# Patient Record
Sex: Female | Born: 1975 | Race: Black or African American | Hispanic: No | Marital: Married | Smoking: Never smoker
Health system: Southern US, Community
[De-identification: ages and names within clinical notes are randomized; demographics above are authoritative.]

## PROBLEM LIST (undated history)

## (undated) ENCOUNTER — Emergency Department (HOSPITAL_BASED_OUTPATIENT_CLINIC_OR_DEPARTMENT_OTHER): Admission: EM | Payer: Medicaid Other | Source: Home / Self Care

## (undated) DIAGNOSIS — E0590 Thyrotoxicosis, unspecified without thyrotoxic crisis or storm: Secondary | ICD-10-CM

## (undated) DIAGNOSIS — K219 Gastro-esophageal reflux disease without esophagitis: Secondary | ICD-10-CM

---

## 2012-01-12 ENCOUNTER — Other Ambulatory Visit (HOSPITAL_COMMUNITY): Payer: Self-pay | Admitting: Obstetrics and Gynecology

## 2012-01-12 DIAGNOSIS — O09529 Supervision of elderly multigravida, unspecified trimester: Secondary | ICD-10-CM

## 2012-01-12 DIAGNOSIS — O26849 Uterine size-date discrepancy, unspecified trimester: Secondary | ICD-10-CM

## 2012-01-18 ENCOUNTER — Ambulatory Visit (HOSPITAL_COMMUNITY): Payer: Self-pay | Attending: Obstetrics and Gynecology

## 2012-01-18 ENCOUNTER — Ambulatory Visit (HOSPITAL_COMMUNITY): Payer: Self-pay

## 2015-11-04 ENCOUNTER — Encounter (HOSPITAL_BASED_OUTPATIENT_CLINIC_OR_DEPARTMENT_OTHER): Payer: Self-pay | Admitting: *Deleted

## 2015-11-04 ENCOUNTER — Emergency Department (HOSPITAL_BASED_OUTPATIENT_CLINIC_OR_DEPARTMENT_OTHER)
Admission: EM | Admit: 2015-11-04 | Discharge: 2015-11-05 | Disposition: A | Discharge status: Home / Self Care | Payer: Medicaid Other | Attending: Emergency Medicine | Admitting: Emergency Medicine

## 2015-11-04 ENCOUNTER — Emergency Department (HOSPITAL_BASED_OUTPATIENT_CLINIC_OR_DEPARTMENT_OTHER): Payer: Medicaid Other

## 2015-11-04 DIAGNOSIS — K219 Gastro-esophageal reflux disease without esophagitis: Secondary | ICD-10-CM

## 2015-11-04 DIAGNOSIS — R1013 Epigastric pain: Secondary | ICD-10-CM

## 2015-11-04 DIAGNOSIS — H5711 Ocular pain, right eye: Secondary | ICD-10-CM | POA: Insufficient documentation

## 2015-11-04 LAB — CBC WITH DIFFERENTIAL/PLATELET
Basophils Absolute: 0 10*3/uL (ref 0.0–0.1)
Basophils Relative: 0 %
EOS PCT: 6 %
Eosinophils Absolute: 0.4 10*3/uL (ref 0.0–0.7)
HCT: 36.8 % (ref 36.0–46.0)
HEMOGLOBIN: 12 g/dL (ref 12.0–15.0)
LYMPHS ABS: 3.3 10*3/uL (ref 0.7–4.0)
Lymphocytes Relative: 55 %
MCH: 27.6 pg (ref 26.0–34.0)
MCHC: 32.6 g/dL (ref 30.0–36.0)
MCV: 84.8 fL (ref 78.0–100.0)
MONOS PCT: 6 %
Monocytes Absolute: 0.3 10*3/uL (ref 0.1–1.0)
NEUTROS PCT: 33 %
Neutro Abs: 1.9 10*3/uL (ref 1.7–7.7)
Platelets: 249 10*3/uL (ref 150–400)
RBC: 4.34 MIL/uL (ref 3.87–5.11)
RDW: 12.7 % (ref 11.5–15.5)
WBC: 5.9 10*3/uL (ref 4.0–10.5)

## 2015-11-04 LAB — URINALYSIS, ROUTINE W REFLEX MICROSCOPIC
Bilirubin Urine: NEGATIVE
GLUCOSE, UA: NEGATIVE mg/dL
KETONES UR: NEGATIVE mg/dL
Nitrite: NEGATIVE
PROTEIN: 100 mg/dL — AB
Specific Gravity, Urine: 1.023 (ref 1.005–1.030)
pH: 6.5 (ref 5.0–8.0)

## 2015-11-04 LAB — COMPREHENSIVE METABOLIC PANEL
ALBUMIN: 4 g/dL (ref 3.5–5.0)
ALK PHOS: 44 U/L (ref 38–126)
ALT: 23 U/L (ref 14–54)
AST: 18 U/L (ref 15–41)
Anion gap: 6 (ref 5–15)
BILIRUBIN TOTAL: 0.5 mg/dL (ref 0.3–1.2)
BUN: 21 mg/dL — AB (ref 6–20)
CALCIUM: 9.3 mg/dL (ref 8.9–10.3)
CO2: 24 mmol/L (ref 22–32)
CREATININE: 0.98 mg/dL (ref 0.44–1.00)
Chloride: 107 mmol/L (ref 101–111)
GFR calc Af Amer: >60 mL/min (ref >60)
GFR calc non Af Amer: >60 mL/min (ref >60)
GLUCOSE: 98 mg/dL (ref 65–99)
Potassium: 4 mmol/L (ref 3.5–5.1)
Sodium: 137 mmol/L (ref 135–145)
TOTAL PROTEIN: 7.2 g/dL (ref 6.5–8.1)

## 2015-11-04 LAB — URINE MICROSCOPIC-ADD ON

## 2015-11-04 LAB — LIPASE, BLOOD: Lipase: 34 U/L (ref 11–51)

## 2015-11-04 LAB — TROPONIN I: Troponin I: <0.03 ng/mL (ref <0.03)

## 2015-11-04 LAB — PREGNANCY, URINE: Preg Test, Ur: NEGATIVE

## 2015-11-04 MED ORDER — GI COCKTAIL ~~LOC~~
30.0000 mL | Freq: Once | ORAL | Status: AC
  Administered 2015-11-04: 30 mL via ORAL
  Filled 2015-11-04: qty 30

## 2015-11-04 MED ORDER — FLUORESCEIN SODIUM 1 MG OP STRP
1.0000 | ORAL_STRIP | Freq: Once | OPHTHALMIC | Status: AC
  Administered 2015-11-04: 1 via OPHTHALMIC
  Filled 2015-11-04: qty 1

## 2015-11-04 MED ORDER — TETRACAINE HCL 0.5 % OP SOLN
1.0000 [drp] | Freq: Once | OPHTHALMIC | Status: AC
  Administered 2015-11-04: 1 [drp] via OPHTHALMIC
  Filled 2015-11-04: qty 4

## 2015-11-04 MED ORDER — ONDANSETRON 4 MG PO TBDP
4.0000 mg | ORAL_TABLET | Freq: Once | ORAL | Status: AC
  Administered 2015-11-04: 4 mg via ORAL
  Filled 2015-11-04: qty 1

## 2015-11-04 NOTE — ED Notes (Addendum)
ED Provider at bedside examining eye that is hurting.

## 2015-11-04 NOTE — ED Notes (Signed)
ED Provider at bedside. 

## 2015-11-04 NOTE — ED Provider Notes (Signed)
[facility] Provider Note   CSN: [id number] Arrival date & time: 11/04/15  2112  By signing my name below, I, Teofilo PodMatthew P. Jamison, attest that this documentation has been prepared under the direction and in the presence of Everlene FarrierWilliam Gaetano Romberger, PA-C. Electronically Signed: Teofilo PodMatthew P. Jamison, ED Scribe. 11/04/2015. 10:44 PM.   History   Chief Complaint Chief Complaint  Patient presents with  . Chest Pain    The history is provided by the patient and the spouse. The history is limited by a language barrier. A language interpreter was used [place]).  HPI Comments:  Ashley Owen is a 40 y.o. female who presents to the Emergency Department complaining of constant epigastric abdominal pain x 15 days. She denies nausea, vomiting or diarrhea. Pain is not worse with eating. She is unable to point to aggravating factors. Pt also complains of intermittent, gradually worsening right eye pain x several days. She reports she thinks something scratched her eye. She does not wear contacts or glasses. Pt reports associated clear eye discharge. No alleviating factors noted. Pt denies double vision, headache, fever, urinary symptoms, GERD, difficulty breathing, chest pain, vomiting, diarrhea, nausea or urinary symptoms. She is currently on her menstrual cycle.   History reviewed. No pertinent past medical history.  There are no active problems to display for this patient.   History reviewed. No pertinent surgical history.  OB History    No data available       Home Medications    Prior to Admission medications   Medication Sig Start Date End Date Taking? Authorizing Provider  erythromycin ophthalmic ointment Place 1 application into the right eye every 6 (six) hours. Place 1/2 inch ribbon of ointment in the affected eye 4 times a day 11/05/15   Everlene FarrierWilliam Kaari Zeigler, PA-C  omeprazole (PRILOSEC) 20 MG capsule Take 1 capsule (20 mg total) by mouth daily. 11/05/15   Everlene FarrierWilliam Ronin Crager, PA-C     Family History No family history on file.  Social History Social History  Substance Use Topics  . Smoking status: Never Smoker  . Smokeless tobacco: Not on file  . Alcohol use Not on file     Allergies   Review of patient's allergies indicates no known allergies.   Review of Systems Review of Systems  Constitutional: Negative for fever.  HENT: Negative for sore throat.   Eyes: Positive for pain and discharge. Negative for photophobia and visual disturbance.  Respiratory: Negative for shortness of breath and wheezing.   Cardiovascular: Negative for chest pain.  Gastrointestinal: Positive for abdominal pain. Negative for blood in stool, diarrhea, nausea and vomiting.  Genitourinary: Negative for difficulty urinating, dysuria, frequency, hematuria and urgency.  Musculoskeletal: Negative for back pain.  Skin: Negative for rash.  Neurological: Negative for headaches.     Physical Exam Updated Vital Signs BP 100/71 (BP Location: Right Arm)   Pulse 69   Temp 98.5 F (36.9 C) (Oral)   Resp 16   Wt 86.7 kg   LMP 11/04/2015   SpO2 96%   Physical Exam  Constitutional: She appears well-developed and well-nourished. No distress.  Nontoxic appearing.  HENT:  Head: Normocephalic and atraumatic.  Right Ear: External ear normal.  Left Ear: External ear normal.  Mouth/Throat: Oropharynx is clear and moist.  Bilateral tympanic membranes are pearly-gray without erythema or loss of landmarks.   Eyes: Conjunctivae and EOM are normal. Pupils are equal, round, and reactive to light. Right eye exhibits no discharge. Left eye exhibits no discharge.  No conjunctival injection or discharge from her eyes.  Bilateral eyes were anesthetized with tetracaine and right eye was stained with fluorescein. No fluorescein uptake on exam. No evidence of corneal abrasion or ulcer. No dendritic lesions. No evidence of foreign body on lid exam. Eye pressures were obtained with tono pen. These were  normal bilaterally.  Neck: Neck supple. No JVD present.  Cardiovascular: Normal rate, regular rhythm, normal heart sounds and intact distal pulses.   Pulmonary/Chest: Effort normal and breath sounds normal. No respiratory distress. She has no wheezes. She has no rales.  Abdominal: Soft. Bowel sounds are normal. She exhibits no distension and no mass. There is tenderness. There is no rebound and no guarding.  Abdomen is soft. Bowel sounds are present. Patient has mild epigastric abdominal tenderness to palpation. No lower abdominal tenderness. No CVA or flank tenderness. No right upper quadrant tenderness. No Murphy sign.  Musculoskeletal: She exhibits no edema or tenderness.  Lymphadenopathy:    She has no cervical adenopathy.  Neurological: She is alert. Coordination normal.  Skin: Skin is warm and dry. Capillary refill takes less than 2 seconds. No rash noted. She is not diaphoretic. No erythema. No pallor.  Psychiatric: She has a normal mood and affect. Her behavior is normal.  Nursing note and vitals reviewed.    ED Treatments / Results  DIAGNOSTIC STUDIES:  Oxygen Saturation is 100% on RA, normal by my interpretation.    COORDINATION OF CARE:  10:44 PM Discussed treatment plan with pt at bedside and pt agreed to plan.   Labs (all labs ordered are listed, but only abnormal results are displayed) Labs Reviewed  COMPREHENSIVE METABOLIC PANEL - Abnormal; Notable for the following:       Result Value   BUN 21 (*)    All other components within normal limits  URINALYSIS, ROUTINE W REFLEX MICROSCOPIC (NOT [facility]) - Abnormal; Notable for the following:    Color, Urine RED (*)    APPearance TURBID (*)    Hgb urine dipstick LARGE (*)    Protein, ur 100 (*)    Leukocytes, UA SMALL (*)    All other components within normal limits  URINE MICROSCOPIC-ADD ON - Abnormal; Notable for the following:    Squamous Epithelial / LPF 0-5 (*)    Bacteria, UA FEW (*)    All other components  within normal limits  CBC WITH DIFFERENTIAL/PLATELET  LIPASE, BLOOD  TROPONIN I  PREGNANCY, URINE    EKG  EKG Interpretation ED ECG REPORT   Date: 11/04/2015  Rate: 65  Rhythm: normal sinus rhythm  QRS Axis: normal  Intervals: normal  ST/T Wave abnormalities: normal  Conduction Disutrbances:none  Narrative Interpretation:   Old EKG Reviewed: none available  I have personally reviewed the EKG tracing and agree with the computerized printout as noted.        Visual Acuity  Right Eye Distance: 20/70 Left Eye Distance: 20/40 Bilateral Distance: 20/25 (Pt did not have corrective lenses with her for these exams.)  Right Eye Near:   Left Eye Near:    Bilateral Near:      Radiology Dg Chest 2 View  Result Date: 11/04/2015 CLINICAL DATA:  40 y/o  F; central chest pain. EXAM: CHEST  2 VIEW COMPARISON:  None. FINDINGS: The heart size and mediastinal contours are within normal limits. Both lungs are clear. The visualized skeletal structures are unremarkable. IMPRESSION: No active cardiopulmonary disease. Electronically Signed   By: Mitzi HansenLance  Furusawa-Stratton M.D.   On:  11/04/2015 22:47    Procedures Procedures (including critical care time)  Medications Ordered in ED Medications  gi cocktail (Maalox,Lidocaine,Donnatal) (30 mLs Oral Given 11/04/15 2258)  ondansetron (ZOFRAN-ODT) disintegrating tablet 4 mg (4 mg Oral Given 11/04/15 2259)  fluorescein ophthalmic strip 1 strip (1 strip Both Eyes Given 11/04/15 2300)  tetracaine (PONTOCAINE) 0.5 % ophthalmic solution 1 drop (1 drop Both Eyes Given 11/04/15 2300)     Initial Impression / Assessment and Plan / ED Course  I have reviewed the triage vital signs and the nursing notes.  Pertinent labs & imaging results that were available during my care of the patient were reviewed by me and considered in my medical decision making (see chart for details).  Clinical Course   This  is a 40 y.o. female who presents to the Emergency  Department complaining of constant epigastric abdominal pain x 15 days. She denies nausea, vomiting or diarrhea. Pain is not worse with eating. She is unable to point to aggravating factors. Pt also complains of intermittent, gradually worsening right eye pain x several days. She reports she thinks something scratched her eye. She does not wear contacts or glasses. Patient speaks [place]. There is a language barrier initially and it was thought she is complaining of chest pain on her presentation to triage. This is why chest pain protocol orders were placed. On my exam patient's afebrile nontoxic appearing. She denies any chest pain or shortness of breath. She otherwise epigastric abdominal pain. Her abdomen is soft and she has epigastric tenderness to palpation. On eye exam no evidence of foreign body, corneal abrasion or corneal ulcer. No forcing uptake on exam. Normal intraocular pressures on exam. Patient reported feeling better after her eye was anesthetized with tetracaine. Will go ahead and start her on erythromycin ointment.  CBC is within normal limits per lipase is within normal limits per troponin is not elevated. CMP is unremarkable. Normal liver enzymes. Urinalysis is remarkable for hemoglobin. Patient is on her menstrual cycle. Pregnancy test is negative. Chest x-ray is unremarkable. EKG is unremarkable.  Patient received GI cocktail and reported that her pain was almost resolved. Suspect GERD versus peptic ulcer. Will start the patient on omeprazole. I encouraged her to follow-up with primary care. I discussed return precautions. I advised the patient to follow-up with their primary care provider this week. I advised the patient to return to the emergency department with new or worsening symptoms or new concerns. The patient and her husband verbalized understanding and agreement with plan.     Final Clinical Impressions(s) / ED Diagnoses   Final diagnoses:  Epigastric abdominal pain   Gastroesophageal reflux disease, esophagitis presence not specified  Acute right eye pain    New Prescriptions Discharge Medication List as of 11/05/2015 12:19 AM    START taking these medications   Details  erythromycin ophthalmic ointment Place 1 application into the right eye every 6 (six) hours. Place 1/2 inch ribbon of ointment in the affected eye 4 times a day, Starting Thu 11/05/2015, Print    omeprazole (PRILOSEC) 20 MG capsule Take 1 capsule (20 mg total) by mouth daily., Starting Thu 11/05/2015, Print       I personally performed the services described in this documentation, which was scribed in my presence. The recorded information has been reviewed and is accurate.      Everlene Farrier, PA-C 11/05/15 1610    Gwyneth Sprout, MD 11/08/15 [phone]

## 2015-11-04 NOTE — ED Triage Notes (Addendum)
Pt c/o chest pain x 15 days pt also c/o eye pain

## 2015-11-05 MED ORDER — OMEPRAZOLE 20 MG PO CPDR: 20.0000 mg | DELAYED_RELEASE_CAPSULE | Freq: Every day | ORAL | 0 refills | Status: DC

## 2015-11-05 MED ORDER — ERYTHROMYCIN 5 MG/GM OP OINT: 1.0000 "application " | TOPICAL_OINTMENT | Freq: Four times a day (QID) | OPHTHALMIC | 1 refills | Status: DC

## 2015-11-05 NOTE — ED Notes (Signed)
Interpreter phone used for discharge instructions by PA and RN.

## 2017-02-07 ENCOUNTER — Emergency Department (HOSPITAL_BASED_OUTPATIENT_CLINIC_OR_DEPARTMENT_OTHER)
Admission: EM | Admit: 2017-02-07 | Discharge: 2017-02-07 | Disposition: A | Discharge status: Home / Self Care | Payer: Medicaid Other | Attending: Emergency Medicine | Admitting: Emergency Medicine

## 2017-02-07 ENCOUNTER — Encounter (HOSPITAL_BASED_OUTPATIENT_CLINIC_OR_DEPARTMENT_OTHER): Payer: Self-pay

## 2017-02-07 ENCOUNTER — Emergency Department (HOSPITAL_BASED_OUTPATIENT_CLINIC_OR_DEPARTMENT_OTHER): Payer: Medicaid Other

## 2017-02-07 ENCOUNTER — Other Ambulatory Visit: Payer: Self-pay

## 2017-02-07 DIAGNOSIS — M62830 Muscle spasm of back: Secondary | ICD-10-CM | POA: Diagnosis not present

## 2017-02-07 DIAGNOSIS — M545 Low back pain, unspecified: Secondary | ICD-10-CM

## 2017-02-07 DIAGNOSIS — R109 Unspecified abdominal pain: Secondary | ICD-10-CM | POA: Insufficient documentation

## 2017-02-07 LAB — COMPREHENSIVE METABOLIC PANEL
ALBUMIN: 4.2 g/dL (ref 3.5–5.0)
ALT: 13 U/L — AB (ref 14–54)
AST: 17 U/L (ref 15–41)
Alkaline Phosphatase: 42 U/L (ref 38–126)
Anion gap: 9 (ref 5–15)
BUN: 17 mg/dL (ref 6–20)
CO2: 27 mmol/L (ref 22–32)
CREATININE: 0.71 mg/dL (ref 0.44–1.00)
Calcium: 9.6 mg/dL (ref 8.9–10.3)
Chloride: 102 mmol/L (ref 101–111)
GFR calc Af Amer: >60 mL/min (ref >60)
GFR calc non Af Amer: >60 mL/min (ref >60)
GLUCOSE: 87 mg/dL (ref 65–99)
POTASSIUM: 4.5 mmol/L (ref 3.5–5.1)
SODIUM: 138 mmol/L (ref 135–145)
Total Bilirubin: 0.9 mg/dL (ref 0.3–1.2)
Total Protein: 7.8 g/dL (ref 6.5–8.1)

## 2017-02-07 LAB — CBC WITH DIFFERENTIAL/PLATELET
Basophils Absolute: 0 10*3/uL (ref 0.0–0.1)
Basophils Relative: 0 %
Eosinophils Absolute: 0.1 10*3/uL (ref 0.0–0.7)
Eosinophils Relative: 2 %
HEMATOCRIT: 38.1 % (ref 36.0–46.0)
HEMOGLOBIN: 12.5 g/dL (ref 12.0–15.0)
LYMPHS ABS: 2.3 10*3/uL (ref 0.7–4.0)
Lymphocytes Relative: 49 %
MCH: 27.8 pg (ref 26.0–34.0)
MCHC: 32.8 g/dL (ref 30.0–36.0)
MCV: 84.9 fL (ref 78.0–100.0)
MONO ABS: 0.3 10*3/uL (ref 0.1–1.0)
MONOS PCT: 6 %
NEUTROS ABS: 2.1 10*3/uL (ref 1.7–7.7)
NEUTROS PCT: 43 %
PLATELETS: 244 10*3/uL (ref 150–400)
RBC: 4.49 MIL/uL (ref 3.87–5.11)
RDW: 13.5 % (ref 11.5–15.5)
WBC: 4.8 10*3/uL (ref 4.0–10.5)

## 2017-02-07 LAB — URINALYSIS, ROUTINE W REFLEX MICROSCOPIC
Bilirubin Urine: NEGATIVE
Glucose, UA: NEGATIVE mg/dL
HGB URINE DIPSTICK: NEGATIVE
KETONES UR: NEGATIVE mg/dL
LEUKOCYTES UA: NEGATIVE
Nitrite: NEGATIVE
PH: 7.5 (ref 5.0–8.0)
Protein, ur: NEGATIVE mg/dL
Specific Gravity, Urine: 1.02 (ref 1.005–1.030)

## 2017-02-07 LAB — PREGNANCY, URINE: Preg Test, Ur: NEGATIVE

## 2017-02-07 LAB — LIPASE, BLOOD: Lipase: 26 U/L (ref 11–51)

## 2017-02-07 MED ORDER — MORPHINE SULFATE (PF) 4 MG/ML IV SOLN
4.0000 mg | Freq: Once | INTRAVENOUS | Status: AC
  Administered 2017-02-07: 4 mg via INTRAVENOUS
  Filled 2017-02-07: qty 1

## 2017-02-07 MED ORDER — CYCLOBENZAPRINE HCL 10 MG PO TABS: 10.0000 mg | ORAL_TABLET | Freq: Two times a day (BID) | ORAL | 0 refills | Status: DC | PRN

## 2017-02-07 NOTE — ED Triage Notes (Signed)
C/o lower back pain and LLQ abd pain x 1 week-denies n/v/d, vaginal d/c-NAD-steady gait

## 2017-02-07 NOTE — Discharge Instructions (Signed)
Your workup today showed no evidence of kidney stone or infection.  Your labs were reassuring.  We suspect you have musculoskeletal pain.  Please use the muscle relaxant and follow-up with a primary doctor.  If any symptoms change or worsen, please return to the nearest emergency department.

## 2017-02-07 NOTE — ED Notes (Signed)
Pt verbalizes understanding of d/c instructions and denies any further needs at this time. 

## 2017-02-07 NOTE — ED Provider Notes (Signed)
[facility] EMERGENCY DEPARTMENT Provider Note   CSN: [id number] Arrival date & time: 02/07/17  1059     History   Chief Complaint Chief Complaint  Patient presents with  . Back Pain    HPI Barba Ena Dawley is a 42 y.o. female.  The history is provided by the patient and medical records. No language interpreter was used.  Flank Pain  The current episode started more than 2 days ago. The problem occurs constantly. The problem has not changed since onset.Pertinent negatives include no chest pain, no abdominal pain, no headaches and no shortness of breath. The symptoms are aggravated by twisting. Nothing relieves the symptoms. She has tried nothing for the symptoms. The treatment provided no relief.    History reviewed. No pertinent past medical history.  There are no active problems to display for this patient.   History reviewed. No pertinent surgical history.  OB History    No data available       Home Medications    Prior to Admission medications   Not on File    Family History No family history on file.  Social History Social History   Tobacco Use  . Smoking status: Never Smoker  . Smokeless tobacco: Never Used  Substance Use Topics  . Alcohol use: No    Frequency: Never  . Drug use: No     Allergies   Patient has no known allergies.   Review of Systems Review of Systems  Constitutional: Negative for chills, diaphoresis, fatigue and fever.  HENT: Negative for congestion.   Eyes: Negative for visual disturbance.  Respiratory: Negative for cough, chest tightness, shortness of breath, wheezing and stridor.   Cardiovascular: Negative for chest pain, palpitations and leg swelling.  Gastrointestinal: Negative for abdominal pain, diarrhea, nausea and vomiting.  Genitourinary: Positive for flank pain. Negative for decreased urine volume, difficulty urinating, dysuria, frequency and urgency.  Musculoskeletal: Positive for back pain. Negative for  neck pain and neck stiffness.  Neurological: Negative for weakness, light-headedness, numbness and headaches.  Psychiatric/Behavioral: Negative for agitation.  All other systems reviewed and are negative.    Physical Exam Updated Vital Signs BP 119/70 (BP Location: Right Arm)   Pulse 74   Temp 97.7 F (36.5 C) (Oral)   Resp 16   Ht 5\' 6"  (1.676 m)   Wt 88.9 kg (196 lb)   LMP 01/26/2017   SpO2 100%   BMI 31.64 kg/m   Physical Exam  Constitutional: She is oriented to person, place, and time. She appears well-developed and well-nourished. No distress.  HENT:  Head: Normocephalic.  Mouth/Throat: Oropharynx is clear and moist. No oropharyngeal exudate.  Eyes: Conjunctivae and EOM are normal. Pupils are equal, round, and reactive to light.  Neck: Normal range of motion.  Cardiovascular: Normal rate and intact distal pulses.  No murmur heard. Pulmonary/Chest: Effort normal and breath sounds normal. No respiratory distress. She has no wheezes. She exhibits no tenderness.  Abdominal: Soft. Normal appearance. She exhibits no distension. There is no tenderness. There is CVA tenderness. There is no rigidity, no rebound and no guarding.    Musculoskeletal: She exhibits tenderness. She exhibits no edema.       Thoracic back: She exhibits tenderness, pain and spasm.       Back:  Neurological: She is alert and oriented to person, place, and time. No cranial nerve deficit or sensory deficit. She exhibits normal muscle tone. Coordination normal.  Skin: Capillary refill takes less than 2 seconds. No rash  noted. She is not diaphoretic.  Psychiatric: She has a normal mood and affect.  Nursing note and vitals reviewed.    ED Treatments / Results  Labs (all labs ordered are listed, but only abnormal results are displayed) Labs Reviewed  URINALYSIS, ROUTINE W REFLEX MICROSCOPIC - Abnormal; Notable for the following components:      Result Value   APPearance CLOUDY (*)    All other  components within normal limits  COMPREHENSIVE METABOLIC PANEL - Abnormal; Notable for the following components:   ALT 13 (*)    All other components within normal limits  PREGNANCY, URINE  CBC WITH DIFFERENTIAL/PLATELET  LIPASE, BLOOD    EKG  EKG Interpretation None       Radiology Ct Renal Stone Study  Result Date: 02/07/2017 CLINICAL DATA:  LEFT flank pain since yesterday, suspected stone disease EXAM: CT ABDOMEN AND PELVIS WITHOUT CONTRAST TECHNIQUE: Multidetector CT imaging of the abdomen and pelvis was performed following the standard protocol without IV contrast. Sagittal and coronal MPR images reconstructed from axial data set. Oral contrast was not administered for this indication. COMPARISON:  None FINDINGS: Lower chest: Lung bases clear Hepatobiliary: Gallbladder and liver normal appearance Pancreas: Normal appearance Spleen: Normal appearance Adrenals/Urinary Tract: Adrenal glands normal appearance. 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 Kidneys, ureters, and bladder otherwise normal appearance. Specifically no urinary tract calcification or dilatation. Stomach/Bowel: Normal appendix. Stomach and bowel loops normal appearance for technique Vascular/Lymphatic: Aorta normal caliber. Small LEFT pelvic phlebolith. No adenopathy. Reproductive: Uterus and adnexa are unremarkable Other: No free air or free fluid. No hernia or inflammatory process. Musculoskeletal: Old appearing superior endplate concavity at T11. No acute osseous findings. IMPRESSION: No acute intra-abdominal or intrapelvic abnormalities. Electronically Signed   By: Ulyses SouthwardMark  Boles M.D.   On: 02/07/2017 15:54    Procedures Procedures (including critical care time)  Medications Ordered in ED Medications  morphine 4 MG/ML injection 4 mg (4 mg Intravenous Given 02/07/17 1556)     Initial Impression / Assessment and Plan / ED Course  I have reviewed the triage vital signs and the nursing notes.  Pertinent labs &  imaging results that were available during my care of the patient were reviewed by me and considered in my medical decision making (see chart for details).      Cecilee Ena DawleyFonrose is a 42 y.o. female with no significant past medical history who presents with left flank pain.  Patient reports that she has had pain for the last week.  She reports that it is worsened with moving twisting or bending.  She says that she has never had this pain before.  She denies a known trauma of onset but reports that lifting things has made it worse.  She denies any history or family history of kidney stones to her knowledge.  She denies any dysuria, hematuria, or urinary changes.  She denies conservation, diarrhea.  She denies vaginal symptoms or pelvic pain.  She denies the pain radiating to her groin but reports it is primarily her left back and left flank.  She is concerned he may be muscular.  Patient denies nausea, vomiting, fevers, chills, or other symptoms.    On exam, patient has tenderness in the left CVA and left flank area.  No significant abdominal tenderness.  Patient had some palpable muscle spasms on exam.  Patient had no numbness, tingling, or weakness of legs.  Exam otherwise unremarkable.  Based on patient's symptoms, I am concerned about muscular skeletal discomfort versus  a kidney stone given the location of pain.  Patient had screening laboratory testing was reassuring.  Given the continuing pain, CT scan was ordered.  CT scan showed no evidence of stone.  Suspect muscular skeletal pain.  Patient had some pain relief with medications.  Patient will be discharged home with prescription for muscle relaxant and PCP follow-up.    Patient understood plan of care as well as return precautions for new or worsened symptoms.  Do not feel patient has other etiology of symptoms at this time.  Doubt infection.    Patient had no other questions or concerns and was discharged in good condition with improving  symptoms.    Final Clinical Impressions(s) / ED Diagnoses   Final diagnoses:  Acute left-sided low back pain without sciatica  Back spasm    ED Discharge Orders        Ordered    cyclobenzaprine (FLEXERIL) 10 MG tablet  2 times daily PRN     02/07/17 1731     Clinical Impression: 1. Acute left-sided low back pain without sciatica   2. Back spasm     Disposition: Discharge  Condition: Good  I have discussed the results, Dx and Tx plan with the pt(& family if present). He/she/they expressed understanding and agree(s) with the plan. Discharge instructions discussed at great length. Strict return precautions discussed and pt &/or family have verbalized understanding of the instructions. No further questions at time of discharge.    New Prescriptions   CYCLOBENZAPRINE (FLEXERIL) 10 MG TABLET    Take 1 tablet (10 mg total) by mouth 2 (two) times daily as needed for muscle spasms.    Follow Up: No follow-up provider specified.    Loucinda Croy, Canary Brim, MD 02/08/17 0100

## 2017-04-14 ENCOUNTER — Other Ambulatory Visit: Payer: Self-pay

## 2017-04-14 ENCOUNTER — Emergency Department (HOSPITAL_BASED_OUTPATIENT_CLINIC_OR_DEPARTMENT_OTHER)
Admission: EM | Admit: 2017-04-14 | Discharge: 2017-04-14 | Disposition: A | Discharge status: Home / Self Care | Payer: Medicaid Other | Attending: Emergency Medicine | Admitting: Emergency Medicine

## 2017-04-14 ENCOUNTER — Encounter (HOSPITAL_BASED_OUTPATIENT_CLINIC_OR_DEPARTMENT_OTHER): Payer: Self-pay | Admitting: *Deleted

## 2017-04-14 DIAGNOSIS — W230XXA Caught, crushed, jammed, or pinched between moving objects, initial encounter: Secondary | ICD-10-CM | POA: Insufficient documentation

## 2017-04-14 DIAGNOSIS — S0003XA Contusion of scalp, initial encounter: Secondary | ICD-10-CM

## 2017-04-14 DIAGNOSIS — S0990XA Unspecified injury of head, initial encounter: Secondary | ICD-10-CM | POA: Diagnosis present

## 2017-04-14 DIAGNOSIS — Y929 Unspecified place or not applicable: Secondary | ICD-10-CM | POA: Insufficient documentation

## 2017-04-14 DIAGNOSIS — Y990 Civilian activity done for income or pay: Secondary | ICD-10-CM | POA: Insufficient documentation

## 2017-04-14 DIAGNOSIS — Y9389 Activity, other specified: Secondary | ICD-10-CM | POA: Insufficient documentation

## 2017-04-14 MED ORDER — KETOROLAC TROMETHAMINE 60 MG/2ML IM SOLN
INTRAMUSCULAR | Status: AC
  Filled 2017-04-14: qty 2

## 2017-04-14 MED ORDER — KETOROLAC TROMETHAMINE 60 MG/2ML IM SOLN
60.0000 mg | Freq: Once | INTRAMUSCULAR | Status: AC
  Administered 2017-04-14: 60 mg via INTRAMUSCULAR

## 2017-04-14 MED ORDER — NAPROXEN 500 MG PO TABS: 500.0000 mg | ORAL_TABLET | Freq: Two times a day (BID) | ORAL | 0 refills | Status: DC

## 2017-04-14 NOTE — Discharge Instructions (Signed)
Apply ice to the area and avoid braiding in that area until hair is growing back

## 2017-04-14 NOTE — ED Provider Notes (Signed)
[facility] EMERGENCY DEPARTMENT Provider Note   CSN: [id number] Arrival date & time: 04/14/17  1412     History   Chief Complaint Chief Complaint  Patient presents with  . Head Injury    HPI Ashley Owen is a 42 y.o. female.  Patient is a 42 year old female with no significant past medical history presenting today with severe scalp pain.  Patient was at work when her braid got stuck in an industrial sized conveyer belt causing it to twist within the belt and it ripped the braid and her hair out of her scalp.  Since that time she has had severe tenderness of that area of her scalp and pain that radiates up into her forehead into the back of her head.  She did not fall during this accident, hit her head or lose consciousness.  She has been taking Tylenol without significant improvement.  She denies any blood loss from this event or cuts to her scalp.  The history is provided by the patient.  Head Injury   Incident onset: 4 days ago. She came to the ER via walk-in. There was no loss of consciousness. There was no blood loss. The quality of the pain is described as throbbing. The pain is at a severity of 8/10. The pain is severe. The pain has been constant since the injury.    History reviewed. No pertinent past medical history.  There are no active problems to display for this patient.   History reviewed. No pertinent surgical history.   OB History   None      Home Medications    Prior to Admission medications   Medication Sig Start Date End Date Taking? Authorizing Provider  naproxen (NAPROSYN) 500 MG tablet Take 1 tablet (500 mg total) by mouth 2 (two) times daily. 04/14/17   Gwyneth Sprout, MD    Family History History reviewed. No pertinent family history.  Social History Social History   Tobacco Use  . Smoking status: Never Smoker  . Smokeless tobacco: Never Used  Substance Use Topics  . Alcohol use: No    Frequency: Never  . Drug use: No      Allergies   Patient has no known allergies.   Review of Systems Review of Systems  All other systems reviewed and are negative.    Physical Exam Updated Vital Signs BP 112/75   Temp 98.7 F (37.1 C)   Resp 16   Ht 5\' 3"  (1.6 m)   Wt 89.8 kg (198 lb)   LMP 04/08/2017   SpO2 100%   BMI 35.07 kg/m   Physical Exam  Constitutional: She is oriented to person, place, and time. She appears well-developed and well-nourished. No distress.  HENT:  Head:    Eyes: Pupils are equal, round, and reactive to light. EOM are normal.  Cardiovascular: Normal rate.  Pulmonary/Chest: Effort normal.  Neurological: She is alert and oriented to person, place, and time.  Skin: Skin is warm and dry.  Psychiatric: She has a normal mood and affect. Her behavior is normal.  Nursing note and vitals reviewed.    ED Treatments / Results  Labs (all labs ordered are listed, but only abnormal results are displayed) Labs Reviewed - No data to display  EKG None  Radiology No results found.  Procedures Procedures (including critical care time)  Medications Ordered in ED Medications  ketorolac (TORADOL) 60 MG/2ML injection (has no administration in time range)  ketorolac (TORADOL) injection 60 mg (60 mg Intramuscular  Given 04/14/17 1608)     Initial Impression / Assessment and Plan / ED Course  I have reviewed the triage vital signs and the nursing notes.  Pertinent labs & imaging results that were available during my care of the patient were reviewed by me and considered in my medical decision making (see chart for details).     Patient here due to persistent scalp pain after a bradycardia was ripped out of her head by an [employer] visitorindustrial conveyor belts.  There is no evidence of infection or abrasion or laceration to this area of her scalp which is now missing hair.  It is very tender but soft.  Feel this is just severe contusion from the trauma of having it ripped out.  Recommended using  NSAIDs and ice.  Recommended following up with her PCP in 1 week symptoms are not improving.  Final Clinical Impressions(s) / ED Diagnoses   Final diagnoses:  Contusion of scalp, initial encounter    ED Discharge Orders        Ordered    naproxen (NAPROSYN) 500 MG tablet  2 times daily     04/14/17 1605       Gwyneth SproutPlunkett, Sayana Salley, MD 04/14/17 1631

## 2017-04-14 NOTE — ED Triage Notes (Addendum)
Pt c/o h/a after getting head braided  Stuck in machine x 4 days ago

## 2018-09-29 ENCOUNTER — Other Ambulatory Visit: Payer: Self-pay

## 2018-09-29 ENCOUNTER — Encounter (HOSPITAL_BASED_OUTPATIENT_CLINIC_OR_DEPARTMENT_OTHER): Payer: Self-pay

## 2018-09-29 ENCOUNTER — Emergency Department (HOSPITAL_BASED_OUTPATIENT_CLINIC_OR_DEPARTMENT_OTHER)
Admission: EM | Admit: 2018-09-29 | Discharge: 2018-09-30 | Disposition: A | Discharge status: Home / Self Care | Payer: Self-pay | Attending: Emergency Medicine | Admitting: Emergency Medicine

## 2018-09-29 DIAGNOSIS — Y999 Unspecified external cause status: Secondary | ICD-10-CM | POA: Insufficient documentation

## 2018-09-29 DIAGNOSIS — W109XXA Fall (on) (from) unspecified stairs and steps, initial encounter: Secondary | ICD-10-CM | POA: Insufficient documentation

## 2018-09-29 DIAGNOSIS — Y929 Unspecified place or not applicable: Secondary | ICD-10-CM | POA: Insufficient documentation

## 2018-09-29 DIAGNOSIS — Y939 Activity, unspecified: Secondary | ICD-10-CM | POA: Insufficient documentation

## 2018-09-29 DIAGNOSIS — S92024A Nondisplaced fracture of anterior process of right calcaneus, initial encounter for closed fracture: Secondary | ICD-10-CM | POA: Insufficient documentation

## 2018-09-29 NOTE — ED Triage Notes (Signed)
Pt fell down 4 stairs and is c/o R leg pain tonight.

## 2018-09-30 ENCOUNTER — Emergency Department (HOSPITAL_BASED_OUTPATIENT_CLINIC_OR_DEPARTMENT_OTHER): Payer: Self-pay

## 2018-09-30 MED ORDER — OXYCODONE-ACETAMINOPHEN 5-325 MG PO TABS: 2.0000 | ORAL_TABLET | ORAL | 0 refills | Status: DC | PRN

## 2018-09-30 MED ORDER — OXYCODONE-ACETAMINOPHEN 5-325 MG PO TABS
1.0000 | ORAL_TABLET | Freq: Once | ORAL | Status: AC
  Administered 2018-09-30: 01:00:00 1 via ORAL
  Filled 2018-09-30: qty 1

## 2018-09-30 NOTE — ED Provider Notes (Signed)
[facility] EMERGENCY DEPARTMENT Provider Note   CSN: [id number] Arrival date & time: 09/29/18  2344     History   Chief Complaint Chief Complaint  Patient presents with  . Fall    HPI Ashley Owen is a 43 y.o. female.      Fall This is a new problem. The current episode started 1 to 2 hours ago. The problem occurs constantly. The problem has not changed since onset.Pertinent negatives include no chest pain. Nothing aggravates the symptoms. Nothing relieves the symptoms. She has tried nothing for the symptoms.    History reviewed. No pertinent past medical history.  There are no active problems to display for this patient.   History reviewed. No pertinent surgical history.   OB History   No obstetric history on file.      Home Medications    Prior to Admission medications   Medication Sig Start Date End Date Taking? Authorizing Provider  naproxen (NAPROSYN) 500 MG tablet Take 1 tablet (500 mg total) by mouth 2 (two) times daily. 04/14/17   Gwyneth Sprout, MD  oxyCODONE-acetaminophen (PERCOCET) 5-325 MG tablet Take 2 tablets by mouth every 4 (four) hours as needed. 09/30/18   Ilian Wessell, Barbara Cower, MD    Family History No family history on file.  Social History Social History   Tobacco Use  . Smoking status: Never Smoker  . Smokeless tobacco: Never Used  Substance Use Topics  . Alcohol use: No    Frequency: Never  . Drug use: No     Allergies   Patient has no known allergies.   Review of Systems Review of Systems  Cardiovascular: Negative for chest pain.  All other systems reviewed and are negative.    Physical Exam Updated Vital Signs BP 106/64 (BP Location: Right Arm)   Pulse 68   Temp 98.6 F (37 C) (Oral)   Resp 20   Ht 5\' 5"  (1.651 m)   Wt 88.5 kg   LMP 09/28/2018   SpO2 100%   BMI 32.45 kg/m   Physical Exam Vitals signs and nursing note reviewed.  Constitutional:      Appearance: She is well-developed.  HENT:   Head: Normocephalic and atraumatic.  Eyes:     Extraocular Movements: Extraocular movements intact.     Conjunctiva/sclera: Conjunctivae normal.  Neck:     Musculoskeletal: Normal range of motion.  Cardiovascular:     Rate and Rhythm: Normal rate and regular rhythm.  Pulmonary:     Effort: Pulmonary effort is normal. No respiratory distress.     Breath sounds: No stridor.  Abdominal:     General: There is no distension.  Musculoskeletal:        General: Swelling (right lateral ankle), tenderness and signs of injury present. No deformity.     Right lower leg: No edema.     Left lower leg: No edema.  Skin:    General: Skin is warm and dry.  Neurological:     Mental Status: She is alert.      ED Treatments / Results  Labs (all labs ordered are listed, but only abnormal results are displayed) Labs Reviewed - No data to display  EKG None  Radiology Dg Ankle Complete Right  Result Date: 09/30/2018 CLINICAL DATA:  Right foot and ankle pain after fall. EXAM: RIGHT ANKLE - COMPLETE 3+ VIEW COMPARISON:  None. FINDINGS: There is no evidence of fracture, dislocation, or joint effusion. Ankle mortise is preserved. There is no evidence of arthropathy or  other focal bone abnormality. Soft tissues are unremarkable. IMPRESSION: Negative radiographs of the right ankle. Electronically Signed   By: Keith Rake M.D.   On: 09/30/2018 01:15   Dg Foot Complete Right  Result Date: 09/30/2018 CLINICAL DATA:  Right foot and ankle pain after fall. EXAM: RIGHT FOOT COMPLETE - 3+ VIEW COMPARISON:  None. FINDINGS: Mildly displaced fracture through the anterolateral process of the calcaneus, best appreciated on the AP view. No other acute fracture. Trace degenerative change at the first metatarsal phalangeal joint. Mild generalized soft tissue edema. IMPRESSION: Mildly displaced fracture through the anterolateral process of the calcaneus. No other acute fracture. Electronically Signed   By: Keith Rake M.D.   On: 09/30/2018 01:16    Procedures Procedures (including critical care time)  Medications Ordered in ED Medications  oxyCODONE-acetaminophen (PERCOCET/ROXICET) 5-325 MG per tablet 1 tablet (1 tablet Oral Given 09/30/18 0042)     Initial Impression / Assessment and Plan / ED Course  I have reviewed the triage vital signs and the nursing notes.  Pertinent labs & imaging results that were available during my care of the patient were reviewed by me and considered in my medical decision making (see chart for details).        Anterolateral process of calcaneus fractured. Cam walker. Pain meds. Ortho follow up.  Final Clinical Impressions(s) / ED Diagnoses   Final diagnoses:  Closed nondisplaced fracture of anterior process of right calcaneus, initial encounter    ED Discharge Orders         Ordered    oxyCODONE-acetaminophen (PERCOCET) 5-325 MG tablet  Every 4 hours PRN     09/30/18 0130           Jazmine Heckman, Corene Cornea, MD 09/30/18 [phone]

## 2019-03-14 IMAGING — CT CT RENAL STONE PROTOCOL
2 of 4 series · 16 of 46 positions shown, 18 images · non-contrast
Comparison: None

CLINICAL DATA: LEFT flank pain since yesterday, suspected stone
disease

EXAM:
CT ABDOMEN AND PELVIS WITHOUT CONTRAST
TECHNIQUE: Multidetector CT imaging of the abdomen and pelvis was performed
following the standard protocol without IV contrast. Sagittal and
coronal MPR images reconstructed from axial data set. Oral contrast
was not administered for this indication.

[Series 2: axial st · axial · 0.98mm/px · z∈[-571,-76]mm · 13 of 109 slices shown, 15 images]
[im 5/109  soft-tissue]
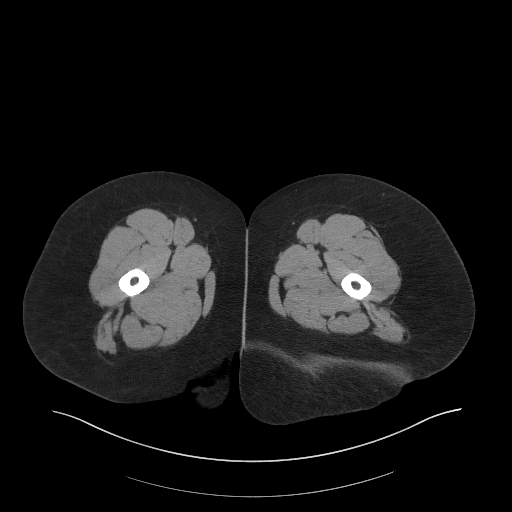
[im 5/109  bone]
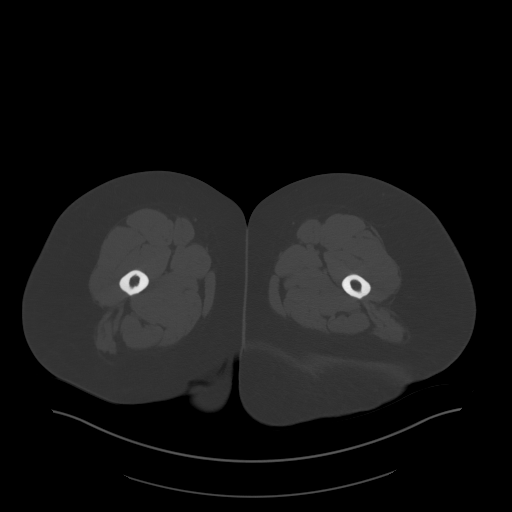
[im 13/109  soft-tissue]
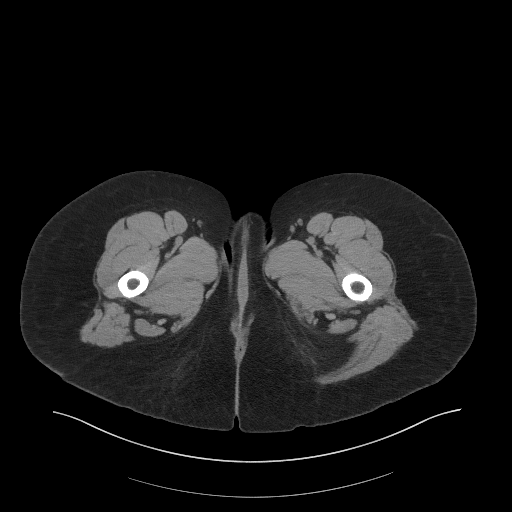
[im 22/109  soft-tissue]
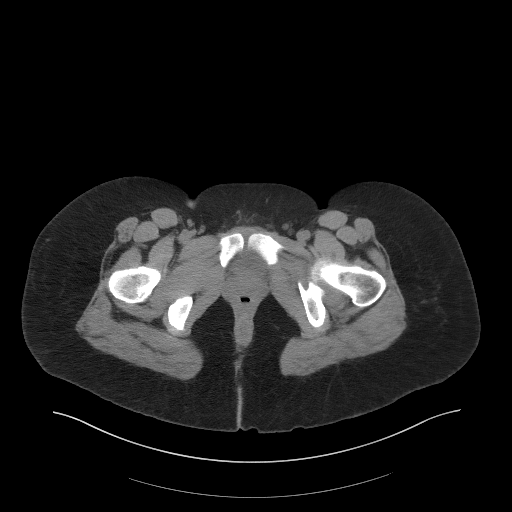
[im 31/109  soft-tissue]
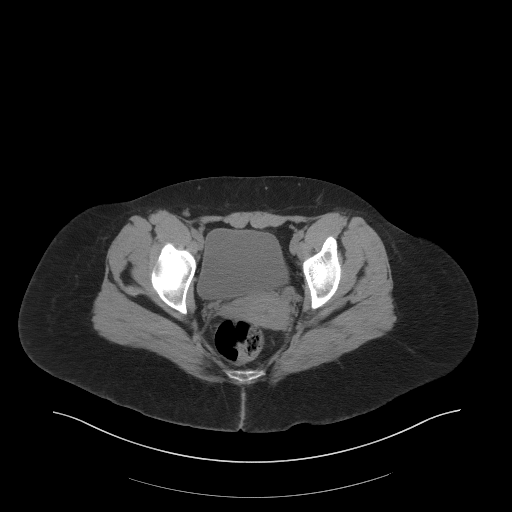
[im 39/109  soft-tissue]
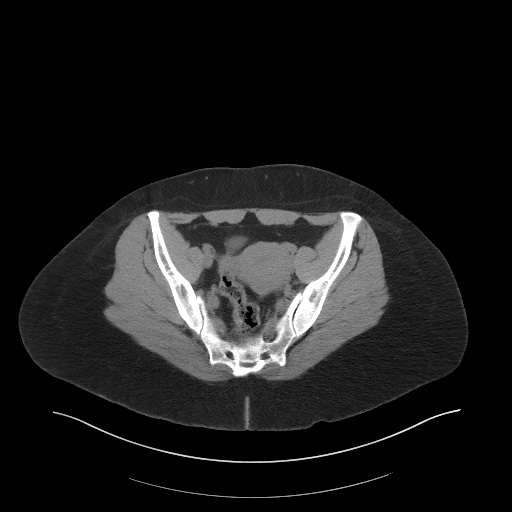
[im 48/109  soft-tissue]
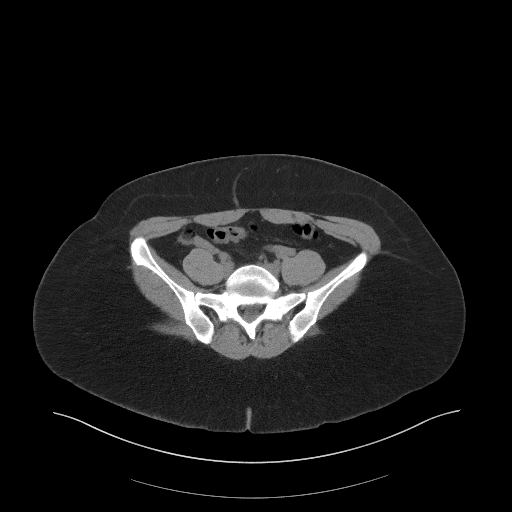
[im 57/109  soft-tissue]
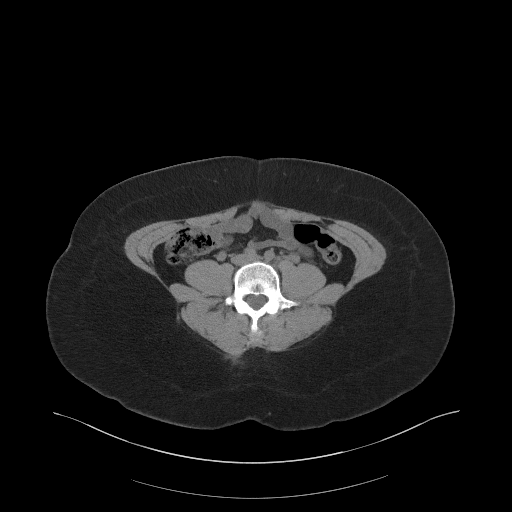
[im 61/109  soft-tissue]
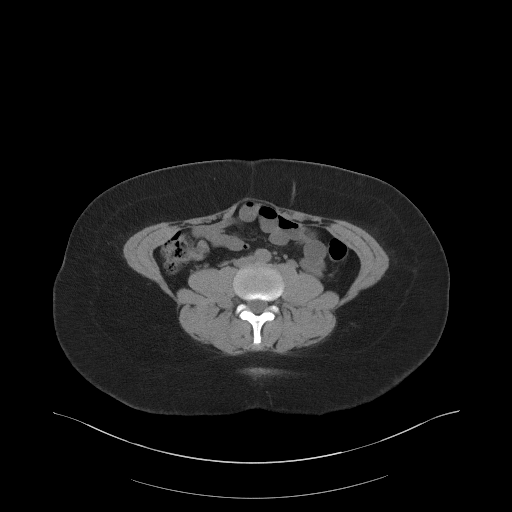
[im 70/109  soft-tissue]
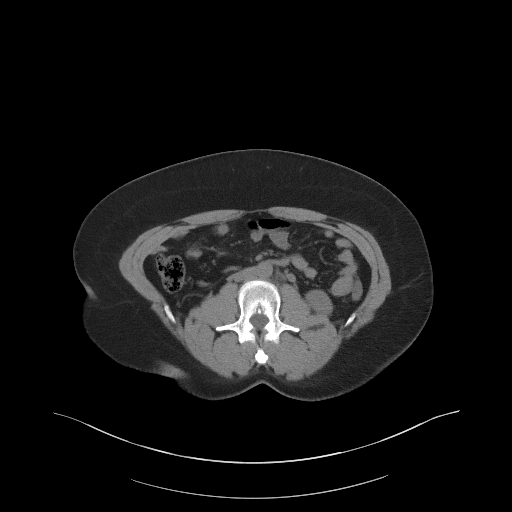
[im 70/109  bone]
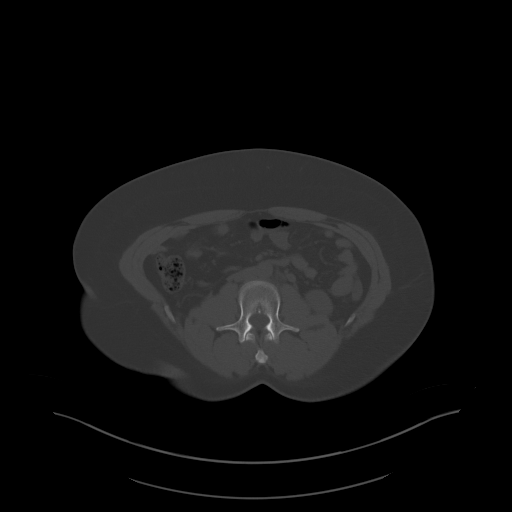
[im 78/109  soft-tissue]
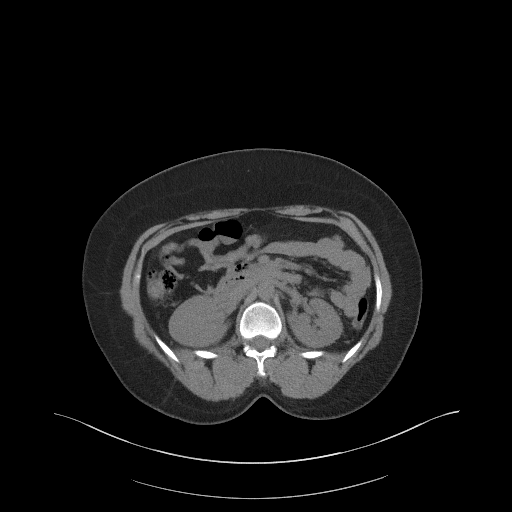
[im 87/109  soft-tissue]
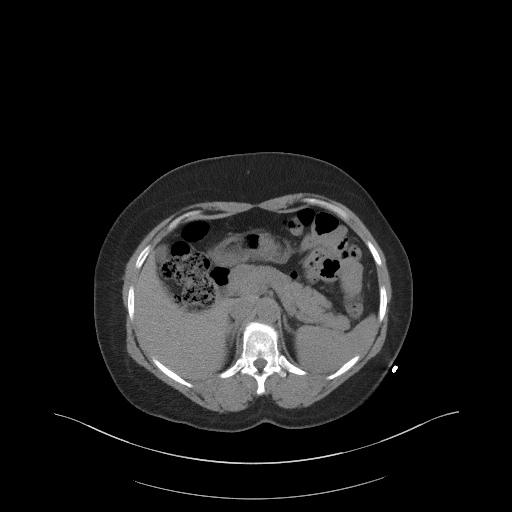
[im 96/109  soft-tissue]
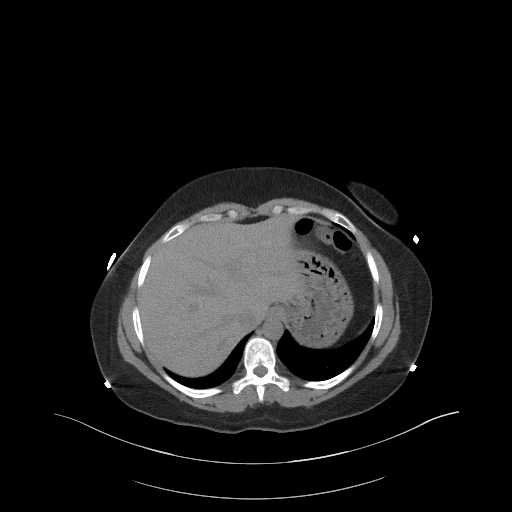
[im 104/109  soft-tissue]
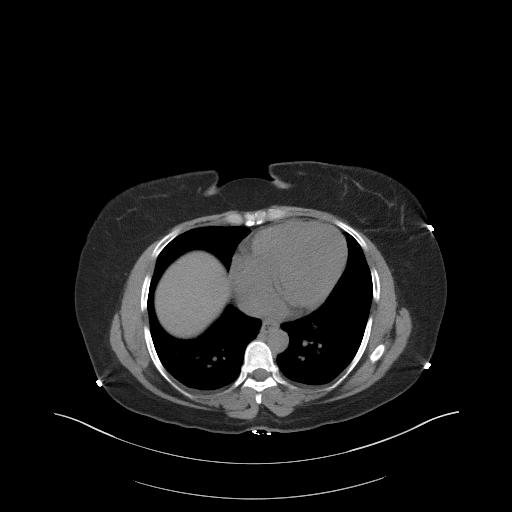

[Series 4: coronal st · coronal · 0.94mm/px · 3 of 96 slices shown]
[im 32/96  soft-tissue]
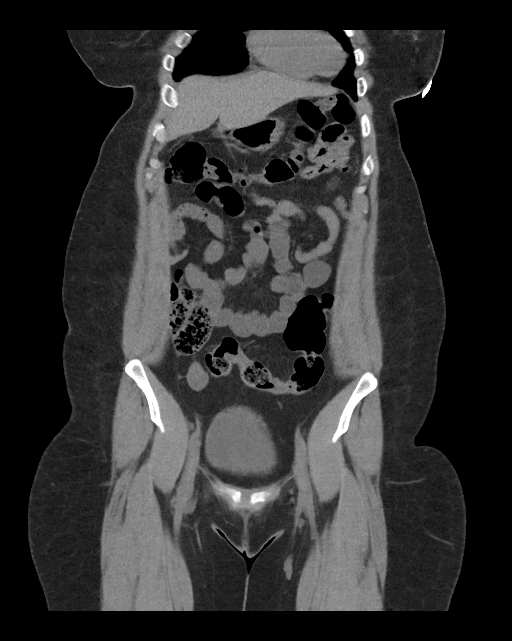
[im 43/96  soft-tissue]
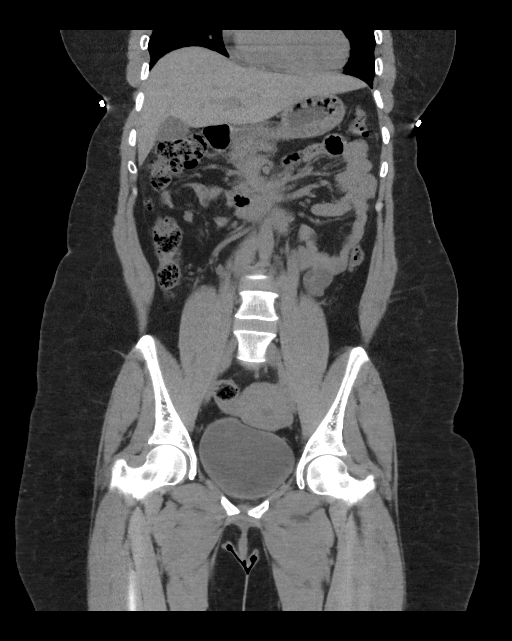
[im 53/96  soft-tissue]
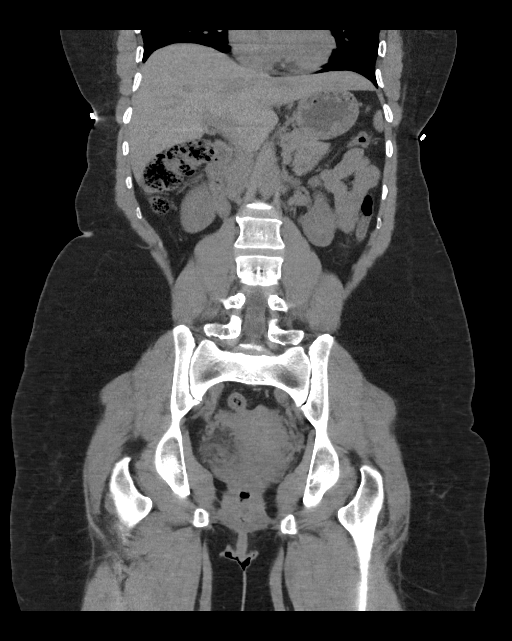

[16 of 46 positions shown; findings below may reference images not displayed]

FINDINGS: Lower chest: Lung bases clear

Hepatobiliary: Gallbladder and liver normal appearance

Pancreas: Normal appearance

Spleen: Normal appearance

Adrenals/Urinary Tract: Adrenal glands normal appearance.
Duplication of the LEFT renal collecting system. Kidneys, ureters,
and bladder otherwise normal appearance. Specifically no urinary
tract calcification or dilatation.

Stomach/Bowel: Normal appendix. Stomach and bowel loops normal
appearance for technique

Vascular/Lymphatic: Aorta normal caliber. Small LEFT pelvic
phlebolith. No adenopathy.

Reproductive: Uterus and adnexa are unremarkable

Other: No free air or free fluid. No hernia or inflammatory process.

Musculoskeletal: Old appearing superior endplate concavity at T11.
No acute osseous findings.
IMPRESSION: No acute intra-abdominal or intrapelvic abnormalities.

## 2019-04-23 ENCOUNTER — Emergency Department (HOSPITAL_BASED_OUTPATIENT_CLINIC_OR_DEPARTMENT_OTHER)
Admission: EM | Admit: 2019-04-23 | Discharge: 2019-04-24 | Disposition: A | Discharge status: Home / Self Care | Payer: 59 | Attending: Emergency Medicine | Admitting: Emergency Medicine

## 2019-04-23 ENCOUNTER — Encounter (HOSPITAL_BASED_OUTPATIENT_CLINIC_OR_DEPARTMENT_OTHER): Payer: Self-pay | Admitting: *Deleted

## 2019-04-23 ENCOUNTER — Other Ambulatory Visit: Payer: Self-pay

## 2019-04-23 DIAGNOSIS — Y9389 Activity, other specified: Secondary | ICD-10-CM | POA: Diagnosis not present

## 2019-04-23 DIAGNOSIS — Y929 Unspecified place or not applicable: Secondary | ICD-10-CM | POA: Insufficient documentation

## 2019-04-23 DIAGNOSIS — M545 Low back pain, unspecified: Secondary | ICD-10-CM

## 2019-04-23 DIAGNOSIS — R1032 Left lower quadrant pain: Secondary | ICD-10-CM | POA: Insufficient documentation

## 2019-04-23 DIAGNOSIS — X500XXA Overexertion from strenuous movement or load, initial encounter: Secondary | ICD-10-CM | POA: Insufficient documentation

## 2019-04-23 DIAGNOSIS — Y999 Unspecified external cause status: Secondary | ICD-10-CM | POA: Diagnosis not present

## 2019-04-23 LAB — URINALYSIS, ROUTINE W REFLEX MICROSCOPIC
Bilirubin Urine: NEGATIVE
Glucose, UA: NEGATIVE mg/dL
Hgb urine dipstick: NEGATIVE
Ketones, ur: NEGATIVE mg/dL
Nitrite: NEGATIVE
Protein, ur: NEGATIVE mg/dL
Specific Gravity, Urine: 1.02 (ref 1.005–1.030)
pH: 7 (ref 5.0–8.0)

## 2019-04-23 LAB — PREGNANCY, URINE: Preg Test, Ur: NEGATIVE

## 2019-04-23 LAB — URINALYSIS, MICROSCOPIC (REFLEX)

## 2019-04-23 NOTE — ED Triage Notes (Signed)
Pt reports left sided back pain that started yesterday. Today, pt began having left flank pain and lower abdominal pain. Denies dysuria. Denies vaginal discharge.

## 2019-04-24 ENCOUNTER — Emergency Department (HOSPITAL_BASED_OUTPATIENT_CLINIC_OR_DEPARTMENT_OTHER): Payer: 59

## 2019-04-24 LAB — COMPREHENSIVE METABOLIC PANEL
ALT: 19 U/L (ref 0–44)
AST: 18 U/L (ref 15–41)
Albumin: 3.6 g/dL (ref 3.5–5.0)
Alkaline Phosphatase: 40 U/L (ref 38–126)
Anion gap: 8 (ref 5–15)
BUN: 14 mg/dL (ref 6–20)
CO2: 24 mmol/L (ref 22–32)
Calcium: 8.9 mg/dL (ref 8.9–10.3)
Chloride: 105 mmol/L (ref 98–111)
Creatinine, Ser: 0.69 mg/dL (ref 0.44–1.00)
GFR calc Af Amer: >60 mL/min (ref >60)
GFR calc non Af Amer: >60 mL/min (ref >60)
Glucose, Bld: 106 mg/dL — ABNORMAL HIGH (ref 70–99)
Potassium: 3.9 mmol/L (ref 3.5–5.1)
Sodium: 137 mmol/L (ref 135–145)
Total Bilirubin: 0.5 mg/dL (ref 0.3–1.2)
Total Protein: 6.9 g/dL (ref 6.5–8.1)

## 2019-04-24 LAB — CBC WITH DIFFERENTIAL/PLATELET
Abs Immature Granulocytes: 0 10*3/uL (ref 0.00–0.07)
Basophils Absolute: 0 10*3/uL (ref 0.0–0.1)
Basophils Relative: 0 %
Eosinophils Absolute: 0.1 10*3/uL (ref 0.0–0.5)
Eosinophils Relative: 2 %
HCT: 37.8 % (ref 36.0–46.0)
Hemoglobin: 12.3 g/dL (ref 12.0–15.0)
Immature Granulocytes: 0 %
Lymphocytes Relative: 52 %
Lymphs Abs: 2.4 10*3/uL (ref 0.7–4.0)
MCH: 28.1 pg (ref 26.0–34.0)
MCHC: 32.5 g/dL (ref 30.0–36.0)
MCV: 86.5 fL (ref 80.0–100.0)
Monocytes Absolute: 0.3 10*3/uL (ref 0.1–1.0)
Monocytes Relative: 7 %
Neutro Abs: 1.9 10*3/uL (ref 1.7–7.7)
Neutrophils Relative %: 39 %
Platelets: 277 10*3/uL (ref 150–400)
RBC: 4.37 MIL/uL (ref 3.87–5.11)
RDW: 13 % (ref 11.5–15.5)
WBC: 4.8 10*3/uL (ref 4.0–10.5)
nRBC: 0 % (ref 0.0–0.2)

## 2019-04-24 MED ORDER — METHOCARBAMOL 500 MG PO TABS: 500.0000 mg | ORAL_TABLET | Freq: Three times a day (TID) | ORAL | 0 refills | Status: DC | PRN

## 2019-04-24 MED ORDER — KETOROLAC TROMETHAMINE 30 MG/ML IJ SOLN
30.0000 mg | Freq: Once | INTRAMUSCULAR | Status: AC
  Administered 2019-04-24: 30 mg via INTRAVENOUS
  Filled 2019-04-24: qty 1

## 2019-04-24 MED ORDER — IBUPROFEN 800 MG PO TABS: 800.0000 mg | ORAL_TABLET | Freq: Three times a day (TID) | ORAL | 0 refills | Status: DC | PRN

## 2019-04-24 NOTE — Discharge Instructions (Addendum)
Your labs, urine and CT scan today were normal.  Your CT did show some constipation.   I recommend that you increase your water and fiber intake. If you are not able to eat foods high in fiber, you may use Benefiber or Metamucil over-the-counter. I also recommend you use MiraLAX 1-2 times a day and Colace 100 mg twice a day to help with bowel movements. These medications are over the counter.  You may use other over-the-counter medications such as Dulcolax, Fleet enemas, magnesium citrate as needed for constipation. Please note that some of these medications may cause you to have abdominal cramping which is normal. If you develop severe abdominal pain, fever (temperature of 100.4 or higher), persistent vomiting, distention of your abdomen, unable to have a bowel movement for 5 days or are not passing gas, please return to the hospital.   I suspect that your back pain is musculoskeletal in nature.  You may alternate Tylenol 1000 mg every 6 hours as needed for pain and ibuprofen 800 mg every 8 hours as needed for pain.  You may alternate heat and ice to this area.  We are also discharging with prescription of muscle relaxers that you may take for pain.  These may make you drowsy.  Please do not take when you have to work or drive.  Do not take with alcohol.

## 2019-04-24 NOTE — ED Provider Notes (Addendum)
TIME SEEN: 12:04 AM  CHIEF COMPLAINT: Left lower back pain  HPI: Patient is a 44 year old female who presents to the emergency department with complaints of left lower back pain that started yesterday.  Radiates into the left lower quadrant.  She denies any injury to her back but states she did lift a box that was heavy yesterday while at work.  No fevers, nausea, vomiting, diarrhea, dysuria, hematuria, vaginal bleeding, vaginal discharge.  No numbness, tingling or focal weakness.  No bowel or bladder incontinence.  Pain is worse with twisting and turning.  No history of kidney stone.  History of back surgery or back injections.  Ashley Owen interpreter used.  ROS: See HPI Constitutional: no fever  Eyes: no drainage  ENT: no runny nose   Cardiovascular:  no chest pain  Resp: no SOB  GI: no vomiting GU: no dysuria Integumentary: no rash  Allergy: no hives  Musculoskeletal: no leg swelling  Neurological: no slurred speech ROS otherwise negative  PAST MEDICAL HISTORY/PAST SURGICAL HISTORY:  History reviewed. No pertinent past medical history.  MEDICATIONS:  Prior to Admission medications   Medication Sig Start Date End Date Taking? Authorizing Provider  naproxen (NAPROSYN) 500 MG tablet Take 1 tablet (500 mg total) by mouth 2 (two) times daily. 04/14/17   Blanchie Dessert, MD  oxyCODONE-acetaminophen (PERCOCET) 5-325 MG tablet Take 2 tablets by mouth every 4 (four) hours as needed. 09/30/18   Mesner, Corene Cornea, MD    ALLERGIES:  No Known Allergies  SOCIAL HISTORY:  Social History   Tobacco Use  . Smoking status: Never Smoker  . Smokeless tobacco: Never Used  Substance Use Topics  . Alcohol use: No    FAMILY HISTORY: History reviewed. No pertinent family history.  EXAM: BP 118/71 (BP Location: Right Arm)   Pulse 68   Temp 97.9 F (36.6 C) (Oral)   Resp 15   LMP 03/18/2019   SpO2 99%  CONSTITUTIONAL: Alert and oriented and responds appropriately to questions.  Well-appearing; well-nourished HEAD: Normocephalic EYES: Conjunctivae clear, pupils appear equal, EOM appear intact ENT: normal nose; moist mucous membranes NECK: Supple, normal ROM CARD: RRR; S1 and S2 appreciated; no murmurs, no clicks, no rubs, no gallops RESP: Normal chest excursion without splinting or tachypnea; breath sounds clear and equal bilaterally; no wheezes, no rhonchi, no rales, no hypoxia or respiratory distress, speaking full sentences ABD/GI: Normal bowel sounds; non-distended; soft, non-tender, no rebound, no guarding, no peritoneal signs, no hepatosplenomegaly BACK:  The back appears normal, no midline spinal tenderness or step-off or deformity, patient has some mild lumbar paraspinal tenderness on the left side without soft tissue swelling, redness, warmth, ecchymosis, rash or other lesions EXT: Normal ROM in all joints; no deformity noted, no edema; no cyanosis SKIN: Normal color for age and race; warm; no rash on exposed skin NEURO: Moves all extremities equally, normal sensation diffusely, normal speech, normal gait, no saddle anesthesia, no clonus PSYCH: The patient's mood and manner are appropriate.   MEDICAL DECISION MAKING: Patient here with lower back pain.  Differential includes kidney stone, musculoskeletal back pain.  Urinalysis reviewed/interpreted.  Doubt pyelonephritis, UTI given urinalysis shows no sign of infection today.  Will obtain labs and CT of the abdomen pelvis.  Will give Toradol for pain control. No red flag symptoms to suggest cauda equina, epidural abscess or hematoma, discitis or osteomyelitis, transverse myelitis.  ED PROGRESS: Patient reports significant improvement in pain.  Labs reviewed/interpreted and show no acute abnormality.  CT reviewed/interpreted.  CT scan  shows no kidney stone or hydronephrosis/ureteral nephrosis.  She does have moderate stool throughout the colon.  Appendix appears normal.  Recommended alternating Tylenol and Motrin for  pain.  Recommended alternating heat and ice for pain.  Suspect that this is musculoskeletal in nature.  No fracture seen on CT imaging.  Recommended bowel regimen to help with constipation.  Will discharge with prescriptions for ibuprofen, Robaxin.  Patient requesting work note through Monday.   At this time, I do not feel there is any life-threatening condition present. I have reviewed, interpreted and discussed all results (EKG, imaging, lab, urine as appropriate) and exam findings with patient/family. I have reviewed nursing notes and appropriate previous records.  I feel the patient is safe to be discharged home without further emergent workup and can continue workup as an outpatient as needed. Discussed usual and customary return precautions. Patient/family verbalize understanding and are comfortable with this plan.  Outpatient follow-up has been provided as needed. All questions have been answered.   Ashley Owen was evaluated in Emergency Department on 04/24/2019 for the symptoms described in the history of present illness. She was evaluated in the context of the global COVID-19 pandemic, which necessitated consideration that the patient might be at risk for infection with the SARS-CoV-2 virus that causes COVID-19. Institutional protocols and algorithms that pertain to the evaluation of patients at risk for COVID-19 are in a state of rapid change based on information released by regulatory bodies including the CDC and federal and state organizations. These policies and algorithms were followed during the patient's care in the ED.      Mithcell Schumpert, Layla Maw, DO 04/24/19 0545    Terriyah Westra, Layla Maw, DO 04/24/19 [phone]

## 2021-09-14 ENCOUNTER — Inpatient Hospital Stay (HOSPITAL_BASED_OUTPATIENT_CLINIC_OR_DEPARTMENT_OTHER)
Admission: EM | Admit: 2021-09-14 | Discharge: 2021-09-23 | DRG: 833 | Disposition: A | Discharge status: Home / Self Care | Payer: BC Managed Care – PPO | Attending: Obstetrics and Gynecology | Admitting: Obstetrics and Gynecology

## 2021-09-14 ENCOUNTER — Other Ambulatory Visit: Payer: Self-pay

## 2021-09-14 ENCOUNTER — Encounter (HOSPITAL_BASED_OUTPATIENT_CLINIC_OR_DEPARTMENT_OTHER): Payer: Self-pay

## 2021-09-14 ENCOUNTER — Emergency Department (HOSPITAL_BASED_OUTPATIENT_CLINIC_OR_DEPARTMENT_OTHER): Payer: BC Managed Care – PPO

## 2021-09-14 ENCOUNTER — Other Ambulatory Visit: Payer: Self-pay | Admitting: Obstetrics and Gynecology

## 2021-09-14 DIAGNOSIS — K8020 Calculus of gallbladder without cholecystitis without obstruction: Secondary | ICD-10-CM

## 2021-09-14 DIAGNOSIS — Z3A13 13 weeks gestation of pregnancy: Secondary | ICD-10-CM | POA: Diagnosis not present

## 2021-09-14 DIAGNOSIS — K117 Disturbances of salivary secretion: Secondary | ICD-10-CM | POA: Diagnosis present

## 2021-09-14 DIAGNOSIS — O26611 Liver and biliary tract disorders in pregnancy, first trimester: Secondary | ICD-10-CM | POA: Diagnosis present

## 2021-09-14 DIAGNOSIS — O26891 Other specified pregnancy related conditions, first trimester: Secondary | ICD-10-CM | POA: Diagnosis present

## 2021-09-14 DIAGNOSIS — O99211 Obesity complicating pregnancy, first trimester: Secondary | ICD-10-CM | POA: Diagnosis present

## 2021-09-14 DIAGNOSIS — O210 Mild hyperemesis gravidarum: Secondary | ICD-10-CM | POA: Diagnosis present

## 2021-09-14 DIAGNOSIS — K760 Fatty (change of) liver, not elsewhere classified: Secondary | ICD-10-CM | POA: Diagnosis present

## 2021-09-14 DIAGNOSIS — O0991 Supervision of high risk pregnancy, unspecified, first trimester: Secondary | ICD-10-CM

## 2021-09-14 DIAGNOSIS — K8050 Calculus of bile duct without cholangitis or cholecystitis without obstruction: Secondary | ICD-10-CM

## 2021-09-14 DIAGNOSIS — Z6791 Unspecified blood type, Rh negative: Secondary | ICD-10-CM

## 2021-09-14 DIAGNOSIS — O09521 Supervision of elderly multigravida, first trimester: Secondary | ICD-10-CM

## 2021-09-14 DIAGNOSIS — O99611 Diseases of the digestive system complicating pregnancy, first trimester: Secondary | ICD-10-CM | POA: Diagnosis present

## 2021-09-14 DIAGNOSIS — Z6832 Body mass index (BMI) 32.0-32.9, adult: Secondary | ICD-10-CM

## 2021-09-14 LAB — CBC
HCT: 36.9 % (ref 36.0–46.0)
Hemoglobin: 12.2 g/dL (ref 12.0–15.0)
MCH: 27.1 pg (ref 26.0–34.0)
MCHC: 33.1 g/dL (ref 30.0–36.0)
MCV: 81.8 fL (ref 80.0–100.0)
Platelets: 268 10*3/uL (ref 150–400)
RBC: 4.51 MIL/uL (ref 3.87–5.11)
RDW: 12.9 % (ref 11.5–15.5)
WBC: 6.1 10*3/uL (ref 4.0–10.5)
nRBC: 0 % (ref 0.0–0.2)

## 2021-09-14 LAB — COMPREHENSIVE METABOLIC PANEL
ALT: 81 U/L — ABNORMAL HIGH (ref 0–44)
AST: 50 U/L — ABNORMAL HIGH (ref 15–41)
Albumin: 3.7 g/dL (ref 3.5–5.0)
Alkaline Phosphatase: 46 U/L (ref 38–126)
Anion gap: 14 (ref 5–15)
BUN: 13 mg/dL (ref 6–20)
CO2: 21 mmol/L — ABNORMAL LOW (ref 22–32)
Calcium: 9.6 mg/dL (ref 8.9–10.3)
Chloride: 100 mmol/L (ref 98–111)
Creatinine, Ser: 0.7 mg/dL (ref 0.44–1.00)
GFR, Estimated: >60 mL/min (ref >60)
Glucose, Bld: 134 mg/dL — ABNORMAL HIGH (ref 70–99)
Potassium: 3.2 mmol/L — ABNORMAL LOW (ref 3.5–5.1)
Sodium: 135 mmol/L (ref 135–145)
Total Bilirubin: 3.4 mg/dL — ABNORMAL HIGH (ref 0.3–1.2)
Total Protein: 7.7 g/dL (ref 6.5–8.1)

## 2021-09-14 LAB — URINALYSIS, ROUTINE W REFLEX MICROSCOPIC
Glucose, UA: 100 mg/dL — AB
Hgb urine dipstick: NEGATIVE
Ketones, ur: >=80 mg/dL — AB
Leukocytes,Ua: NEGATIVE
Nitrite: NEGATIVE
Protein, ur: 100 mg/dL — AB
Specific Gravity, Urine: 1.02 (ref 1.005–1.030)
pH: 7.5 (ref 5.0–8.0)

## 2021-09-14 LAB — URINALYSIS, MICROSCOPIC (REFLEX)

## 2021-09-14 LAB — HCG, QUANTITATIVE, PREGNANCY: hCG, Beta Chain, Quant, S: >250000 m[IU]/mL — ABNORMAL HIGH (ref <5)

## 2021-09-14 LAB — LIPASE, BLOOD: Lipase: 39 U/L (ref 11–51)

## 2021-09-14 MED ORDER — FENTANYL CITRATE PF 50 MCG/ML IJ SOSY
50.0000 ug | PREFILLED_SYRINGE | Freq: Once | INTRAMUSCULAR | Status: AC
  Administered 2021-09-14: 50 ug via INTRAVENOUS
  Filled 2021-09-14: qty 1

## 2021-09-14 MED ORDER — FAMOTIDINE IN NACL 20-0.9 MG/50ML-% IV SOLN
20.0000 mg | Freq: Once | INTRAVENOUS | Status: AC
  Administered 2021-09-14: 20 mg via INTRAVENOUS
  Filled 2021-09-14: qty 50

## 2021-09-14 MED ORDER — SODIUM CHLORIDE 0.9 % IV SOLN
INTRAVENOUS | Status: DC
  Administered 2021-09-15: 100 mL/h via INTRAVENOUS

## 2021-09-14 MED ORDER — PANTOPRAZOLE SODIUM 40 MG IV SOLR
40.0000 mg | Freq: Every day | INTRAVENOUS | Status: DC
  Administered 2021-09-15 – 2021-09-22 (×9): 40 mg via INTRAVENOUS
  Filled 2021-09-14 (×9): qty 10

## 2021-09-14 MED ORDER — ONDANSETRON HCL 4 MG/2ML IJ SOLN: 4.0000 mg | Freq: Three times a day (TID) | INTRAMUSCULAR | Status: AC | PRN

## 2021-09-14 MED ORDER — SODIUM CHLORIDE 0.9 % IV BOLUS
1000.0000 mL | Freq: Once | INTRAVENOUS | Status: AC
  Administered 2021-09-14: 1000 mL via INTRAVENOUS

## 2021-09-14 MED ORDER — LACTATED RINGERS IV BOLUS
1000.0000 mL | Freq: Once | INTRAVENOUS | Status: AC
  Administered 2021-09-14: 1000 mL via INTRAVENOUS

## 2021-09-14 MED ORDER — ONDANSETRON HCL 4 MG/2ML IJ SOLN
4.0000 mg | Freq: Once | INTRAMUSCULAR | Status: AC
  Administered 2021-09-14: 4 mg via INTRAVENOUS
  Filled 2021-09-14: qty 2

## 2021-09-14 MED ORDER — OXYCODONE HCL 5 MG PO TABS
5.0000 mg | ORAL_TABLET | Freq: Four times a day (QID) | ORAL | Status: DC | PRN
  Administered 2021-09-15 – 2021-09-23 (×3): 10 mg via ORAL
  Filled 2021-09-14 (×3): qty 2

## 2021-09-14 MED ORDER — LACTATED RINGERS IV SOLN: INTRAVENOUS | Status: AC

## 2021-09-14 MED ORDER — MORPHINE SULFATE (PF) 4 MG/ML IV SOLN
4.0000 mg | Freq: Once | INTRAVENOUS | Status: AC
  Administered 2021-09-14: 4 mg via INTRAVENOUS
  Filled 2021-09-14: qty 1

## 2021-09-14 NOTE — ED Provider Notes (Addendum)
[facility] EMERGENCY DEPARTMENT Provider Note   CSN: [id number] Arrival date & time: 09/14/21  1553     History  Chief Complaint  Patient presents with   Emesis During Pregnancy    Ashley Owen is a 46 y.o. female here presenting with hyperemesis gravidarum.  Patient is about [redacted] weeks pregnant.  Patient states that she has not seen OB doctor and had not gotten a ultrasound yet.  She states that she has been vomiting during this pregnancy.  She states that she has been prescribed Zofran.  She was seen about a week ago at [facility] for similar symptoms.  At that time she states that she has acid taste in her mouth and she has been vomiting some blood.  Her CBC was unremarkable at that time.  She was given IV fluids and Zofran and was able to tolerate p.o. and was sent home.  Patient states that she has been having persistent vomiting since then and some dysuria.  She was not given antibiotics during the ED visit and was noted to have some bacteria in her urine.  She states that she has persistent dysuria as well.  Also has right upper quadrant pain.  The history is provided by the patient.       Home Medications Prior to Admission medications   Medication Sig Start Date End Date Taking? Authorizing Provider  ibuprofen (ADVIL) 800 MG tablet Take 1 tablet (800 mg total) by mouth every 8 (eight) hours as needed for mild pain. 04/24/19   Ward, Layla Maw, DO  methocarbamol (ROBAXIN) 500 MG tablet Take 1 tablet (500 mg total) by mouth every 8 (eight) hours as needed for muscle spasms. 04/24/19   Ward, Layla Maw, DO  naproxen (NAPROSYN) 500 MG tablet Take 1 tablet (500 mg total) by mouth 2 (two) times daily. 04/14/17   Gwyneth Sprout, MD  oxyCODONE-acetaminophen (PERCOCET) 5-325 MG tablet Take 2 tablets by mouth every 4 (four) hours as needed. 09/30/18   Mesner, Barbara Cower, MD      Allergies    Patient has no known allergies.    Review of Systems   Review of Systems   Gastrointestinal:  Positive for vomiting.  Genitourinary:  Positive for dysuria.  All other systems reviewed and are negative.   Physical Exam Updated Vital Signs BP 106/73 (BP Location: Right Arm)   Pulse (!) 101   Temp 98.6 F (37 C)   Resp 18   Ht 5\' 5"  (1.651 m)   Wt 87 kg   SpO2 100%   BMI 31.92 kg/m  Physical Exam Vitals and nursing note reviewed.  Constitutional:      Comments: Slightly uncomfortable and vomiting  HENT:     Head: Normocephalic.     Nose: Nose normal.     Mouth/Throat:     Mouth: Mucous membranes are dry.  Eyes:     Extraocular Movements: Extraocular movements intact.     Pupils: Pupils are equal, round, and reactive to light.  Cardiovascular:     Rate and Rhythm: Normal rate and regular rhythm.     Pulses: Normal pulses.     Heart sounds: Normal heart sounds.  Pulmonary:     Effort: Pulmonary effort is normal.     Breath sounds: Normal breath sounds.  Abdominal:     General: Abdomen is flat.     Palpations: Abdomen is soft.     Comments: Gravid uterus and nontender  Musculoskeletal:  General: Normal range of motion.     Cervical back: Normal range of motion and neck supple.  Skin:    General: Skin is warm.     Capillary Refill: Capillary refill takes less than 2 seconds.  Neurological:     General: No focal deficit present.     Mental Status: She is oriented to person, place, and time.  Psychiatric:        Mood and Affect: Mood normal.        Behavior: Behavior normal.     ED Results / Procedures / Treatments   Labs (all labs ordered are listed, but only abnormal results are displayed) Labs Reviewed  URINE CULTURE  CBC  LIPASE, BLOOD  COMPREHENSIVE METABOLIC PANEL  URINALYSIS, ROUTINE W REFLEX MICROSCOPIC  PREGNANCY, URINE  HCG, QUANTITATIVE, PREGNANCY    EKG EKG Interpretation  Date/Time:  Tuesday September 14 2021 16:18:17 EDT Ventricular Rate:  82 PR Interval:  127 QRS Duration: 84 QT Interval:  366 QTC  Calculation: 428 R Axis:   8 Text Interpretation: Sinus rhythm Abnormal R-wave progression, early transition No significant change since last tracing Confirmed by Richardean Canal [phone]) on 09/14/2021 4:21:09 PM  Radiology No results found.  Procedures Procedures    EMERGENCY DEPARTMENT [place] PREGNANCY "Study: Limited Ultrasound of the Pelvis for Pregnancy"  INDICATIONS:Pregnancy(required) Multiple views of the uterus and pelvic cavity were obtained in real-time with a multi-frequency probe.  APPROACH:Transabdominal  PERFORMED BY: Myself IMAGES ARCHIVED?: Yes LIMITATIONS: Body habitus PREGNANCY FREE FLUID: None ADNEXAL FINDINGS:Left ovary not seen and Right ovary not seen GESTATIONAL AGE, ESTIMATE: 12 weeks 2 days FETAL HEART RATE: 188 INTERPRETATION: Intrauterine gestational sac noted, Yolk sac noted, Fetal pole present, and Fetal heart activity seen  CRITICAL CARE Performed by: Richardean Canal   Total critical care time: 30 minutes  Critical care time was exclusive of separately billable procedures and treating other patients.  Critical care was necessary to treat or prevent imminent or life-threatening deterioration.  Critical care was time spent personally by me on the following activities: development of treatment plan with patient and/or surrogate as well as nursing, discussions with consultants, evaluation of patient's response to treatment, examination of patient, obtaining history from patient or surrogate, ordering and performing treatments and interventions, ordering and review of laboratory studies, ordering and review of radiographic studies, pulse oximetry and re-evaluation of patient's condition.    Medications Ordered in ED Medications  famotidine (PEPCID) IVPB 20 mg premix (20 mg Intravenous New Bag/Given 09/14/21 1700)    ED Course/ Medical Decision Making/ A&P                           Medical Decision Making Ashley Owen is a 46 y.o. female here presenting  with hyperemesis.  Patient is about [redacted] weeks pregnant.  I performed an OB ultrasound and patient has normal fetal heart rate and normal fetal movements.  Patient has been vomiting blood occasionally since last week.  I think likely reflux as she has acid taste in her mouth.  Plan to repeat a CBC and get CMP and quantitative hCG.  We will give IV fluids and Zofran and p.o. trial.  We will also get UA to rule out UTI.  7:18 PM Patient's LFTs were elevated with T. bili of 3.4 and AST of 50 and ALT of 81.  Right upper quadrant ultrasound showed cholelithiasis and sludge.  There is no biliary dilation.  Discussed case  with Dr. Dossie Der from surgery.  He states that he recommend admission to the hospitalist service.  Surgery will see patient as consult.  She may need ERCP but he will discuss risks and benefits of getting a procedure done while she is pregnant.  At this point, will admit for pain control and surgery and possible GI evaluation.  7:38 PM Discussed with Dr. Margo Aye from hospitalist. She states that usually, OB will admit these patients. Talked to Dr. Vergie Living from [facility].  He will admit the patient to antepartum service.  Problems Addressed: Biliary colic: acute illness or injury Hyperemesis gravidarum: acute illness or injury  Amount and/or Complexity of Data Reviewed Labs: ordered. Decision-making details documented in ED Course. Radiology: ordered and independent interpretation performed. Decision-making details documented in ED Course. ECG/medicine tests: ordered and independent interpretation performed. Decision-making details documented in ED Course.  Risk Prescription drug management. Decision regarding hospitalization.    Final Clinical Impression(s) / ED Diagnoses Final diagnoses:  None    Rx / DC Orders ED Discharge Orders     None         Charlynne Pander, MD 09/14/21 Jerene Bears    Charlynne Pander, MD 09/14/21 2300

## 2021-09-14 NOTE — ED Notes (Signed)
Report given to [facility] RN. 

## 2021-09-14 NOTE — ED Notes (Signed)
Has not yet seen an OBGYN

## 2021-09-14 NOTE — ED Triage Notes (Signed)
3 months pregnant, c/o vomiting, dizziness, fatigue. States vomiting blood. Has been vomiting daily during pregnancy, unable to tolerate PO. Vomiting in triage. C/o pain in chest & back when vomiting. C/o frequent urination with bad odor.   Interpreter Lourdia [phone] used during triage

## 2021-09-14 NOTE — ED Notes (Signed)
ED TO INPATIENT HANDOFF REPORT  ED Nurse Name and Phone #: Dominga Ferry NRP  S Name/Age/Gender Ashley Owen 46 y.o. female Room/Bed: MH05/MH05  Code Status   Code Status: Not on file  Home/SNF/Other Home Patient oriented to: self, place, time, and situation Is this baseline? Yes   Triage Complete: Triage complete  Chief Complaint Biliary colic [K80.50]  Triage Note 3 months pregnant, c/o vomiting, dizziness, fatigue. States vomiting blood. Has been vomiting daily during pregnancy, unable to tolerate PO. Vomiting in triage. C/o pain in chest & back when vomiting. C/o frequent urination with bad odor.   Interpreter Lourdia [phone] used during triage   Allergies No Known Allergies  Level of Care/Admitting Diagnosis ED Disposition     ED Disposition  Admit   Condition  --   Comment  Hospital Area: [facility] [[id number]]  Level of Care: Antepartum [20]  May admit patient to [facility] or [facility] if equivalent level of care is available:: No  Interfacility transfer: Yes  Covid Evaluation: Asymptomatic - no recent exposure (last 10 days) testing not required  Diagnosis: Biliary colic [193790]  Admitting Physician: [place] Bing [[id number]]  Attending Physician: [place] Bing [phone]  Certification:: I certify this patient will need inpatient services for at least 2 midnights  Estimated Length of Stay: 2          B Medical/Surgery History No past medical history on file. No past surgical history on file.   A IV Location/Drains/Wounds Patient Lines/Drains/Airways Status     Active Line/Drains/Airways     Name Placement date Placement time Site Days   Peripheral IV 09/14/21 20 G Anterior;Proximal;Right Forearm 09/14/21  1620  Forearm  less than 1            Intake/Output Last 24 hours  Intake/Output Summary (Last 24 hours) at 09/14/2021 2247 Last data filed at 09/14/2021 2010 Gross per 24 hour  Intake 2002.29 ml  Output --   Net 2002.29 ml    Labs/Imaging Results for orders placed or performed during the hospital encounter of 09/14/21 (from the past 48 hour(s))  Lipase, blood     Status: None   Collection Time: 09/14/21  4:41 PM  Result Value Ref Range   Lipase 39 11 - 51 U/L    Comment: Performed at [facility], [address] [place], [place] [address]  Comprehensive metabolic panel     Status: Abnormal   Collection Time: 09/14/21  4:41 PM  Result Value Ref Range   Sodium 135 135 - 145 mmol/L   Potassium 3.2 (L) 3.5 - 5.1 mmol/L   Chloride 100 98 - 111 mmol/L   CO2 21 (L) 22 - 32 mmol/L   Glucose, Bld 134 (H) 70 - 99 mg/dL    Comment: Glucose reference range applies only to samples taken after fasting for at least 8 hours.   BUN 13 6 - 20 mg/dL   Creatinine, Ser 9.62 0.44 - 1.00 mg/dL   Calcium 9.6 8.9 - 22.9 mg/dL   Total Protein 7.7 6.5 - 8.1 g/dL   Albumin 3.7 3.5 - 5.0 g/dL   AST 50 (H) 15 - 41 U/L   ALT 81 (H) 0 - 44 U/L   Alkaline Phosphatase 46 38 - 126 U/L   Total Bilirubin 3.4 (H) 0.3 - 1.2 mg/dL   GFR, Estimated >79 >89 mL/min    Comment: (NOTE) Calculated using the CKD-EPI Creatinine Equation (2021)    Anion gap 14  5 - 15    Comment: Performed at [facility], [address] [place], [place] [address]  CBC     Status: None   Collection Time: 09/14/21  4:41 PM  Result Value Ref Range   WBC 6.1 4.0 - 10.5 K/uL   RBC 4.51 3.87 - 5.11 MIL/uL   Hemoglobin 12.2 12.0 - 15.0 g/dL   HCT 65.9 93.5 - 70.1 %   MCV 81.8 80.0 - 100.0 fL   MCH 27.1 26.0 - 34.0 pg   MCHC 33.1 30.0 - 36.0 g/dL   RDW 77.9 39.0 - 30.0 %   Platelets 268 150 - 400 K/uL   nRBC 0.0 0.0 - 0.2 %    Comment: Performed at [facility], [address] [place], [place] [address]  hCG, quantitative, pregnancy     Status: Abnormal   Collection Time: 09/14/21  4:41 PM  Result Value Ref Range   hCG, Beta Chain, Quant, S >250,000 (H) <5 mIU/mL    Comment: RESULT CONFIRMED BY AUTOMATED  DILUTION Performed at [facility], [address] [place], [place] [address]   Urinalysis, Routine w reflex microscopic Urine, Clean Catch     Status: Abnormal   Collection Time: 09/14/21  7:58 PM  Result Value Ref Range   Color, Urine AMBER (A) YELLOW    Comment: BIOCHEMICALS MAY BE AFFECTED BY COLOR   APPearance CLEAR CLEAR   Specific Gravity, Urine 1.020 1.005 - 1.030   pH 7.5 5.0 - 8.0   Glucose, UA 100 (A) NEGATIVE mg/dL   Hgb urine dipstick NEGATIVE NEGATIVE   Bilirubin Urine LARGE (A) NEGATIVE   Ketones, ur >=80 (A) NEGATIVE mg/dL   Protein, ur 633 (A) NEGATIVE mg/dL   Nitrite NEGATIVE NEGATIVE   Leukocytes,Ua NEGATIVE NEGATIVE    Comment: Performed at [facility], [address] [place], [place] [address]  Urinalysis, Microscopic (reflex)     Status: Abnormal   Collection Time: 09/14/21  7:58 PM  Result Value Ref Range   RBC / HPF 0-5 0 - 5 RBC/hpf   WBC, UA 0-5 0 - 5 WBC/hpf   Bacteria, UA FEW (A) NONE SEEN   Squamous Epithelial / LPF 0-5 0 - 5   Mucus PRESENT     Comment: Performed at [facility], [address] [place], [place] [address]   US Abdomen Limited RUQ (LIVER/GB)  Result Date: 09/14/2021 CLINICAL DATA:  Right upper quadrant pain nausea vomiting EXAM: ULTRASOUND ABDOMEN LIMITED RIGHT UPPER QUADRANT COMPARISON:  CT abdomen pelvis 04/24/2019 FINDINGS: Gallbladder: Gallstones measuring up to 10 mm. Gallbladder sludge. No gallbladder wall thickening. Negative surround graphic Murphy sign Common bile duct: Diameter: 3.4 mm Liver: Diffusely increased echogenicity liver without focal liver lesion. Portal vein is patent on color Doppler imaging with normal direction of blood flow towards the liver. Other: None IMPRESSION: Cholelithiasis and gallbladder sludge. Negative sonographic Murphy sign and no biliary dilatation Fatty liver Electronically Signed   By: Marlan Palau M.D.   On: 09/14/2021 18:40    Pending Labs Unresulted Labs  (From admission, onward)     Start     Ordered   09/14/21 1623  Urine Culture  Once,   URGENT       Question:  Indication  Answer:  Dysuria   09/14/21 1622            Vitals/Pain Today's Vitals   09/14/21 1900 09/14/21 1933 09/14/21  1954 09/14/21 2040  BP: 104/64  104/64   Pulse: 83  75   Resp: 19  14   Temp:   98.4 F (36.9 C)   TempSrc:   Oral   SpO2: 100%     Weight:      Height:      PainSc:  5   9     Isolation Precautions No active isolations  Medications Medications  lactated ringers infusion ( Intravenous New Bag/Given 09/14/21 2011)  ondansetron (ZOFRAN) injection 4 mg (has no administration in time range)  famotidine (PEPCID) IVPB 20 mg premix (0 mg Intravenous Stopped 09/14/21 1730)  lactated ringers bolus 1,000 mL (0 mLs Intravenous Stopped 09/14/21 1909)  ondansetron (ZOFRAN) injection 4 mg (4 mg Intravenous Given 09/14/21 1752)  fentaNYL (SUBLIMAZE) injection 50 mcg (50 mcg Intravenous Given 09/14/21 1907)  sodium chloride 0.9 % bolus 1,000 mL (0 mLs Intravenous Stopped 09/14/21 2010)  morphine (PF) 4 MG/ML injection 4 mg (4 mg Intravenous Given 09/14/21 2042)    Mobility walks     Focused Assessments Cardiac Assessment Handoff:    Lab Results  Component Value Date   TROPONINI <0.03 11/04/2015   No results found for: "DDIMER" Does the Patient currently have chest pain? No    R Recommendations: See Admitting Provider Note  Report given to:   Additional Notes: Patient speaks [place]

## 2021-09-14 NOTE — ED Notes (Signed)
Report given to floor RN

## 2021-09-15 ENCOUNTER — Inpatient Hospital Stay (HOSPITAL_COMMUNITY): Payer: BC Managed Care – PPO

## 2021-09-15 ENCOUNTER — Encounter (HOSPITAL_COMMUNITY): Payer: Self-pay | Admitting: Obstetrics and Gynecology

## 2021-09-15 DIAGNOSIS — O0991 Supervision of high risk pregnancy, unspecified, first trimester: Secondary | ICD-10-CM

## 2021-09-15 DIAGNOSIS — K8050 Calculus of bile duct without cholangitis or cholecystitis without obstruction: Secondary | ICD-10-CM | POA: Diagnosis not present

## 2021-09-15 DIAGNOSIS — Z6791 Unspecified blood type, Rh negative: Secondary | ICD-10-CM | POA: Diagnosis present

## 2021-09-15 DIAGNOSIS — O09521 Supervision of elderly multigravida, first trimester: Secondary | ICD-10-CM | POA: Diagnosis present

## 2021-09-15 DIAGNOSIS — Z6832 Body mass index (BMI) 32.0-32.9, adult: Secondary | ICD-10-CM

## 2021-09-15 DIAGNOSIS — O99211 Obesity complicating pregnancy, first trimester: Secondary | ICD-10-CM | POA: Diagnosis present

## 2021-09-15 LAB — CBC WITH DIFFERENTIAL/PLATELET
Abs Immature Granulocytes: 0.02 10*3/uL (ref 0.00–0.07)
Basophils Absolute: 0 10*3/uL (ref 0.0–0.1)
Basophils Relative: 0 %
Eosinophils Absolute: 0.1 10*3/uL (ref 0.0–0.5)
Eosinophils Relative: 1 %
HCT: 31 % — ABNORMAL LOW (ref 36.0–46.0)
Hemoglobin: 10.1 g/dL — ABNORMAL LOW (ref 12.0–15.0)
Immature Granulocytes: 0 %
Lymphocytes Relative: 29 %
Lymphs Abs: 1.5 10*3/uL (ref 0.7–4.0)
MCH: 27.2 pg (ref 26.0–34.0)
MCHC: 32.6 g/dL (ref 30.0–36.0)
MCV: 83.3 fL (ref 80.0–100.0)
Monocytes Absolute: 0.4 10*3/uL (ref 0.1–1.0)
Monocytes Relative: 9 %
Neutro Abs: 3.1 10*3/uL (ref 1.7–7.7)
Neutrophils Relative %: 61 %
Platelets: 204 10*3/uL (ref 150–400)
RBC: 3.72 MIL/uL — ABNORMAL LOW (ref 3.87–5.11)
RDW: 12.8 % (ref 11.5–15.5)
WBC: 5.2 10*3/uL (ref 4.0–10.5)
nRBC: 0 % (ref 0.0–0.2)

## 2021-09-15 LAB — COMPREHENSIVE METABOLIC PANEL
ALT: 69 U/L — ABNORMAL HIGH (ref 0–44)
AST: 46 U/L — ABNORMAL HIGH (ref 15–41)
Albumin: 2.6 g/dL — ABNORMAL LOW (ref 3.5–5.0)
Alkaline Phosphatase: 33 U/L — ABNORMAL LOW (ref 38–126)
Anion gap: 10 (ref 5–15)
BUN: 9 mg/dL (ref 6–20)
CO2: 22 mmol/L (ref 22–32)
Calcium: 8.4 mg/dL — ABNORMAL LOW (ref 8.9–10.3)
Chloride: 107 mmol/L (ref 98–111)
Creatinine, Ser: 0.66 mg/dL (ref 0.44–1.00)
GFR, Estimated: >60 mL/min (ref >60)
Glucose, Bld: 91 mg/dL (ref 70–99)
Potassium: 3 mmol/L — ABNORMAL LOW (ref 3.5–5.1)
Sodium: 139 mmol/L (ref 135–145)
Total Bilirubin: 3.7 mg/dL — ABNORMAL HIGH (ref 0.3–1.2)
Total Protein: 5.8 g/dL — ABNORMAL LOW (ref 6.5–8.1)

## 2021-09-15 LAB — URINE CULTURE: Culture: NO GROWTH

## 2021-09-15 LAB — BILIRUBIN, DIRECT: Bilirubin, Direct: 1.4 mg/dL — ABNORMAL HIGH (ref 0.0–0.2)

## 2021-09-15 LAB — HEPATIC FUNCTION PANEL
ALT: 71 U/L — ABNORMAL HIGH (ref 0–44)
AST: 47 U/L — ABNORMAL HIGH (ref 15–41)
Albumin: 2.7 g/dL — ABNORMAL LOW (ref 3.5–5.0)
Alkaline Phosphatase: 33 U/L — ABNORMAL LOW (ref 38–126)
Bilirubin, Direct: 1.4 mg/dL — ABNORMAL HIGH (ref 0.0–0.2)
Indirect Bilirubin: 2 mg/dL — ABNORMAL HIGH (ref 0.3–0.9)
Total Bilirubin: 3.4 mg/dL — ABNORMAL HIGH (ref 0.3–1.2)
Total Protein: 5.4 g/dL — ABNORMAL LOW (ref 6.5–8.1)

## 2021-09-15 LAB — AMYLASE: Amylase: 92 U/L (ref 28–100)

## 2021-09-15 LAB — TYPE AND SCREEN
ABO/RH(D): O NEG
Antibody Screen: NEGATIVE

## 2021-09-15 LAB — LIPASE, BLOOD: Lipase: 39 U/L (ref 11–51)

## 2021-09-15 MED ORDER — POTASSIUM CHLORIDE IN NACL 40-0.9 MEQ/L-% IV SOLN
INTRAVENOUS | Status: DC
  Filled 2021-09-15 (×9): qty 1000

## 2021-09-15 MED ORDER — SODIUM CHLORIDE 0.9 % IV SOLN
25.0000 mg | Freq: Four times a day (QID) | INTRAVENOUS | Status: DC | PRN
  Administered 2021-09-15 – 2021-09-22 (×6): 25 mg via INTRAVENOUS
  Filled 2021-09-15 (×3): qty 1
  Filled 2021-09-15: qty 25
  Filled 2021-09-15 (×2): qty 1

## 2021-09-15 NOTE — Progress Notes (Signed)
OB Note Patient getting OB ultrasound currently. Will come back later to see. Admit orders in and general surgery has been contacted to let them know that she has arrived from [facility] [employer].   [place] Bing MD OB consult number: [phone]

## 2021-09-15 NOTE — Consult Note (Signed)
Consulting Physician: Hyman Hopes Pricilla Moehle  Referring Provider: [place] Bing, MD - OBGYN  Chief Complaint: Abdominal pain  Reason for Consult: Gallstones, biliary colic   Subjective   HPI: Ashley Owen is an 46 y.o. female who is here for gallstones and biliary colic.  She is [redacted] weeks pregnant and deals with hyperemesis issues.  She has had right upper quadrant pain in the past, especially after eating fatty foods.  Recently she hasn't been eating much due to the hyperemesis and still is having right upper quadrant abdominal pain.  She is admitted to the OBGYN service and surgery was asked to evaluate for possible cholecystectomy.  History reviewed. No pertinent past medical history.  History reviewed. No pertinent surgical history.  History reviewed. No pertinent family history.  Social:  reports that she has never smoked. She has never used smokeless tobacco. She reports that she does not drink alcohol and does not use drugs.  Allergies: No Known Allergies  Medications: Current Outpatient Medications  Medication Instructions   ibuprofen (ADVIL) 800 mg, Oral, Every 8 hours PRN   methocarbamol (ROBAXIN) 500 mg, Oral, Every 8 hours PRN   naproxen (NAPROSYN) 500 mg, Oral, 2 times daily   oxyCODONE-acetaminophen (PERCOCET) 5-325 MG tablet 2 tablets, Oral, Every 4 hours PRN    ROS - all of the below systems have been reviewed with the patient and positives are indicated with bold text General: chills, fever or night sweats Eyes: blurry vision or double vision ENT: epistaxis or sore throat Allergy/Immunology: itchy/watery eyes or nasal congestion Hematologic/Lymphatic: bleeding problems, blood clots or swollen lymph nodes Endocrine: temperature intolerance or unexpected weight changes Breast: new or changing breast lumps or nipple discharge Resp: cough, shortness of breath, or wheezing CV: chest pain or dyspnea on exertion GI: as per HPI GU: dysuria, trouble voiding, or  hematuria MSK: joint pain or joint stiffness Neuro: TIA or stroke symptoms Derm: pruritus and skin lesion changes Psych: anxiety and depression  Objective   PE Blood pressure (!) 91/53, pulse 82, temperature 97.9 F (36.6 C), temperature source Oral, resp. rate 18, height 5\' 5"  (1.651 m), weight 87 kg, SpO2 100 %. Constitutional: NAD; conversant; no deformities Eyes: Moist conjunctiva; no lid lag; anicteric; PERRL Neck: Trachea midline; no thyromegaly Lungs: Normal respiratory effort; no tactile fremitus CV: RRR; no palpable thrills; no pitting edema GI: Abd Soft, mild RUQ tenderness under costal margin; no palpable hepatosplenomegaly MSK: Normal range of motion of extremities; no clubbing/cyanosis Psychiatric: Appropriate affect; alert and oriented x3 Lymphatic: No palpable cervical or axillary lymphadenopathy  Results for orders placed or performed during the hospital encounter of 09/14/21 (from the past 24 hour(s))  Lipase, blood     Status: None   Collection Time: 09/14/21  4:41 PM  Result Value Ref Range   Lipase 39 11 - 51 U/L  Comprehensive metabolic panel     Status: Abnormal   Collection Time: 09/14/21  4:41 PM  Result Value Ref Range   Sodium 135 135 - 145 mmol/L   Potassium 3.2 (L) 3.5 - 5.1 mmol/L   Chloride 100 98 - 111 mmol/L   CO2 21 (L) 22 - 32 mmol/L   Glucose, Bld 134 (H) 70 - 99 mg/dL   BUN 13 6 - 20 mg/dL   Creatinine, Ser 11/14/21 0.44 - 1.00 mg/dL   Calcium 9.6 8.9 - 3.61 mg/dL   Total Protein 7.7 6.5 - 8.1 g/dL   Albumin 3.7 3.5 - 5.0 g/dL   AST 50 (  H) 15 - 41 U/L   ALT 81 (H) 0 - 44 U/L   Alkaline Phosphatase 46 38 - 126 U/L   Total Bilirubin 3.4 (H) 0.3 - 1.2 mg/dL   GFR, Estimated >01 >02 mL/min   Anion gap 14 5 - 15  CBC     Status: None   Collection Time: 09/14/21  4:41 PM  Result Value Ref Range   WBC 6.1 4.0 - 10.5 K/uL   RBC 4.51 3.87 - 5.11 MIL/uL   Hemoglobin 12.2 12.0 - 15.0 g/dL   HCT 72.5 36.6 - 44.0 %   MCV 81.8 80.0 - 100.0 fL    MCH 27.1 26.0 - 34.0 pg   MCHC 33.1 30.0 - 36.0 g/dL   RDW 34.7 42.5 - 95.6 %   Platelets 268 150 - 400 K/uL   nRBC 0.0 0.0 - 0.2 %  hCG, quantitative, pregnancy     Status: Abnormal   Collection Time: 09/14/21  4:41 PM  Result Value Ref Range   hCG, Beta Chain, Quant, S >250,000 (H) <5 mIU/mL  Urinalysis, Routine w reflex microscopic Urine, Clean Catch     Status: Abnormal   Collection Time: 09/14/21  7:58 PM  Result Value Ref Range   Color, Urine AMBER (A) YELLOW   APPearance CLEAR CLEAR   Specific Gravity, Urine 1.020 1.005 - 1.030   pH 7.5 5.0 - 8.0   Glucose, UA 100 (A) NEGATIVE mg/dL   Hgb urine dipstick NEGATIVE NEGATIVE   Bilirubin Urine LARGE (A) NEGATIVE   Ketones, ur >=80 (A) NEGATIVE mg/dL   Protein, ur 387 (A) NEGATIVE mg/dL   Nitrite NEGATIVE NEGATIVE   Leukocytes,Ua NEGATIVE NEGATIVE  Urinalysis, Microscopic (reflex)     Status: Abnormal   Collection Time: 09/14/21  7:58 PM  Result Value Ref Range   RBC / HPF 0-5 0 - 5 RBC/hpf   WBC, UA 0-5 0 - 5 WBC/hpf   Bacteria, UA FEW (A) NONE SEEN   Squamous Epithelial / LPF 0-5 0 - 5   Mucus PRESENT   Type and screen     Status: None   Collection Time: 09/15/21  4:48 AM  Result Value Ref Range   ABO/RH(D) O NEG    Antibody Screen NEG    Sample Expiration      09/18/2021,2359 Performed at [facility], [address] [place], [place] [address]   CBC with Differential/Platelet     Status: Abnormal   Collection Time: 09/15/21  4:48 AM  Result Value Ref Range   WBC 5.2 4.0 - 10.5 K/uL   RBC 3.72 (L) 3.87 - 5.11 MIL/uL   Hemoglobin 10.1 (L) 12.0 - 15.0 g/dL   HCT 29.5 (L) 18.8 - 41.6 %   MCV 83.3 80.0 - 100.0 fL   MCH 27.2 26.0 - 34.0 pg   MCHC 32.6 30.0 - 36.0 g/dL   RDW 60.6 30.1 - 60.1 %   Platelets 204 150 - 400 K/uL   nRBC 0.0 0.0 - 0.2 %   Neutrophils Relative % 61 %   Neutro Abs 3.1 1.7 - 7.7 K/uL   Lymphocytes Relative 29 %   Lymphs Abs 1.5 0.7 - 4.0 K/uL   Monocytes Relative 9 %   Monocytes  Absolute 0.4 0.1 - 1.0 K/uL   Eosinophils Relative 1 %   Eosinophils Absolute 0.1 0.0 - 0.5 K/uL   Basophils Relative 0 %   Basophils Absolute 0.0 0.0 - 0.1 K/uL   Immature Granulocytes 0 %  Abs Immature Granulocytes 0.02 0.00 - 0.07 K/uL  Comprehensive metabolic panel     Status: Abnormal   Collection Time: 09/15/21  4:48 AM  Result Value Ref Range   Sodium 139 135 - 145 mmol/L   Potassium 3.0 (L) 3.5 - 5.1 mmol/L   Chloride 107 98 - 111 mmol/L   CO2 22 22 - 32 mmol/L   Glucose, Bld 91 70 - 99 mg/dL   BUN 9 6 - 20 mg/dL   Creatinine, Ser 6.29 0.44 - 1.00 mg/dL   Calcium 8.4 (L) 8.9 - 10.3 mg/dL   Total Protein 5.8 (L) 6.5 - 8.1 g/dL   Albumin 2.6 (L) 3.5 - 5.0 g/dL   AST 46 (H) 15 - 41 U/L   ALT 69 (H) 0 - 44 U/L   Alkaline Phosphatase 33 (L) 38 - 126 U/L   Total Bilirubin 3.7 (H) 0.3 - 1.2 mg/dL   GFR, Estimated >47 >65 mL/min   Anion gap 10 5 - 15  Amylase     Status: None   Collection Time: 09/15/21  4:48 AM  Result Value Ref Range   Amylase 92 28 - 100 U/L  Lipase, blood     Status: None   Collection Time: 09/15/21  4:48 AM  Result Value Ref Range   Lipase 39 11 - 51 U/L     Imaging Orders         US Abdomen Limited RUQ (LIVER/GB)    Cholelithiasis and gallbladder sludge. Negative sonographic Murphy sign and no biliary dilatation         US OB Comp Less 14 Wks    Single intrauterine gestation. Crown-rump length of 55.2 mm compatible with 12 week 1 day gestational age. Fetal heart rate was visualized at 167 beats per minute. No unexpected finding  Assessment and Plan   Ashley Owen is an 46 y.o. female with gallstones and symptoms of biliary colic.  She also has hyperemesis related to being [redacted] weeks pregnant, making it difficult to tell what symptoms are related to gallstones and what are related to pregnancy.  She has some elevation in her bilirubin, which is trending up since yesterday.  Her transaminase levels are only mildly elevated so I wonder if the  bilirubin is elevated related to her other issues and not a biliary obstruction.  We will order fractionated bilirubin to help sort this out.  Once this results, the surgery team will provide additional recommendations.   Quentin Ore, MD  [facility] Use [email or web address] to contact on call provider  New Patient Billing: [id number] - High MDM

## 2021-09-15 NOTE — H&P (Signed)
Obstetrics Admission History & Physical  09/15/2021 - 7:54 AM Primary OBGYN: None  Chief Complaint: persistent n/v, gallstones on imaging, elevated LFTs  History of Present Illness  46 y.o. [id number] @ [redacted]w[redacted]d (Dating: LMP=12wk u/s), with the above CC. Pregnancy complicated by: AMA, BMI low 30s.  Ashley Owen with persistent n/v and has presented to her PCP and the [facility] ED several times over the past few weeks. She presented to the [facility] ED last night with her s/s and her labs were now abnormal. Imaging done and showed GB stones and sludge. Gen surg consulted over there as well as the hospitalist service and OB called for admission and she was transferred over here this morning.  OB u/s done to confirm dates and single live pregnancy noted, consistent with LMP.   Patient states her s/s are a little better. She does endorse ruq discomfort. No miscarriage s/s.   Review of Systems: as noted in the History of Present Illness.  Patient Active Problem List   Diagnosis Date Noted   AMA (advanced maternal age) multigravida 35+, first trimester 09/15/2021   Supervision of high risk pregnancy, antepartum, first trimester 09/15/2021   BMI 32.0-32.9,adult 09/15/2021   Obesity affecting pregnancy in first trimester 09/15/2021   Rh negative state in antepartum period, first trimester 09/15/2021   Biliary colic 09/14/2021   Cholelithiasis affecting pregnancy in first trimester, antepartum 09/14/2021     PMHx: History reviewed. No pertinent past medical history. PSHx: History reviewed. No pertinent surgical history. Medications: None  Allergies: has No Known Allergies. OBHx:  OB History  Gravida Para Term Preterm AB Living  4 3 3      0  SAB IAB Ectopic Multiple Live Births          3    # Outcome Date GA Lbr Len/2nd Weight Sex Delivery Anes PTL Lv  4 Current           3 Term           2 Term           1 Term             Obstetric Comments  SVD x 3. Last pregnancy in 2014.   No issues or  complications with those pregnancies             FHx: History reviewed. No pertinent family history. Soc Hx:  Social History   Socioeconomic History   Marital status: Married    Spouse name: Not on file   Number of children: Not on file   Years of education: Not on file   Highest education level: Not on file  Occupational History   Not on file  Tobacco Use   Smoking status: Never   Smokeless tobacco: Never  Vaping Use   Vaping Use: Never used  Substance and Sexual Activity   Alcohol use: No   Drug use: No   Sexual activity: Never    Birth control/protection: None  Other Topics Concern   Not on file  Social History Narrative   Not on file   Social Determinants of Health   Financial Resource Strain: Not on file  Food Insecurity: No Food Insecurity (09/14/2021)   Hunger Vital Sign    Worried About Running Out of Food in the Last Year: Never true    Ran Out of Food in the Last Year: Never true  Transportation Needs: No Transportation Needs (09/14/2021)   PRAPARE - Transportation    Lack of  Transportation (Medical): No    Lack of Transportation (Non-Medical): No  Physical Activity: Not on file  Stress: Not on file  Social Connections: Not on file  Intimate Partner Violence: Not At Risk (09/14/2021)   Humiliation, Afraid, Rape, and Kick questionnaire    Fear of Current or Ex-Partner: No    Emotionally Abused: No    Physically Abused: No    Sexually Abused: No    Objective    Current Vital Signs 24h Vital Sign Ranges  T (!) 97.4 F (36.3 C) Temp  Avg: 98 F (36.7 C)  Min: 97.4 F (36.3 C)  Max: 98.6 F (37 C)  BP 96/65 BP  Min: 91/53  Max: 113/69  HR 85 Pulse  Avg: 84  Min: 75  Max: 101  RR 18 Resp  Avg: 18.1  Min: 14  Max: 23  SaO2 98 % Room Air SpO2  Avg: 99.1 %  Min: 96 %  Max: 100 %       24 Hour I/O Current Shift I/O  Time Ins Outs 09/12 0701 - 09/13 0700 In: 2348.7 [I.V.:346.4] Out: 0  No intake/output data recorded.    General: Well nourished, well  developed female in no acute distress.  Skin:  Warm and dry.  Cardiovascular: S1, S2 normal, no murmur, rub or gallop, regular rate and rhythm Respiratory:  Clear to auscultation bilateral. Normal respiratory effort Abdomen: obese, soft, nttp, nd. Negative murphy's Neuro/Psych:  Normal mood and affect.    Labs  Pending: UCx Recent Labs  Lab 09/14/21 1641 09/15/21 0448  WBC 6.1 5.2  HGB 12.2 10.1*  HCT 36.9 31.0*  PLT 268 204    Recent Labs  Lab 09/14/21 1641 09/15/21 0448  NA 135 139  K 3.2* 3.0*  CL 100 107  CO2 21* 22  BUN 13 9  CREATININE 0.70 0.66  CALCIUM 9.6 8.4*  PROT 7.7 5.8*  BILITOT 3.4* 3.7*  ALKPHOS 46 33*  ALT 81* 69*  AST 50* 46*  GLUCOSE 134* 91    Radiology Narrative & Impression  CLINICAL DATA:  Dating   EXAM: OBSTETRIC <14 WK ULTRASOUND   TECHNIQUE: Transabdominal ultrasound was performed for evaluation of the gestation as well as the maternal uterus and adnexal regions.   COMPARISON:  None Available.   FINDINGS: Intrauterine gestational sac: Single   Yolk sac:  Not Visualized.   Embryo:  Visualized.   Cardiac Activity: Visualized.   Heart Rate: 167 bpm   MSD:    mm    w     d   CRL:   55.2 mm mm   12 w 1 d                  [place] EDC: 03/29/2022   Subchorionic hemorrhage:  None visualized.   Maternal uterus/adnexae: Unremarkable.  No free fluid.   IMPRESSION: Single intrauterine gestation. Crown-rump length of 55.2 mm compatible with 12 week 1 day gestational age. Fetal heart rate was visualized at 167 beats per minute. No unexpected finding.     Electronically Signed   By: Minerva Fester M.D.   On: 09/15/2021 01:18   Narrative & Impression  CLINICAL DATA:  Right upper quadrant pain nausea vomiting   EXAM: ULTRASOUND ABDOMEN LIMITED RIGHT UPPER QUADRANT   COMPARISON:  CT abdomen pelvis 04/24/2019   FINDINGS: Gallbladder:   Gallstones measuring up to 10 mm. Gallbladder sludge. No gallbladder wall thickening.  Negative surround graphic Murphy sign   Common bile duct:  Diameter: 3.4 mm   Liver:   Diffusely increased echogenicity liver without focal liver lesion. Portal vein is patent on color Doppler imaging with normal direction of blood flow towards the liver.   Other: None   IMPRESSION: Cholelithiasis and gallbladder sludge. Negative sonographic Murphy sign and no biliary dilatation   Fatty liver     Electronically Signed   By: Marlan Palau M.D.   On: 09/14/2021 18:40    Assessment & Plan   46 y.o. [id number] @ [redacted]w[redacted]d with gallstones and s/s; patient stable *Pregnancy: fetal dopplers qday. F/u ucx *GI/MIVF: has been seen by gen surg this morning and extra lab work added on. I d/w them that Gen surg is managing the GI and will follow their recommendations. I told them that if they feel surgery is indicated, that, from an OB perspective, it's fine to do. KCL added to her fluids. NPO with sips for meds  *PPx: SCDs currently. If not for OR, can do qday lovenox *Analgesia: PO PRNs *Dispo: unclear  Cornelia Copa MD Attending Center for [facility]) GYN Consult Phone: [phone] (M-F, 0800-1700) & [phone] (Off hours, weekends, holidays)

## 2021-09-15 NOTE — Progress Notes (Addendum)
Unable to obtain fetal heart tones via doppler. Performed bedside u/s. Fetal cardiac activity and fetal movement were seen. Notified Dr. Despina Hidden.

## 2021-09-15 NOTE — Progress Notes (Signed)
Patient ID: Ashley Owen, female   DOB: [birth year], 46 y.o.   MRN: [id number]  LFTs reviewed Seems more indirect issue then biliary obstruction Would rec trial of PO If no pain OK to ADAT and f/u after delivery to address gallstones if that con't to be an issues.

## 2021-09-16 DIAGNOSIS — K8050 Calculus of bile duct without cholangitis or cholecystitis without obstruction: Secondary | ICD-10-CM | POA: Diagnosis not present

## 2021-09-16 LAB — COMPREHENSIVE METABOLIC PANEL
ALT: 83 U/L — ABNORMAL HIGH (ref 0–44)
AST: 50 U/L — ABNORMAL HIGH (ref 15–41)
Albumin: 2.6 g/dL — ABNORMAL LOW (ref 3.5–5.0)
Alkaline Phosphatase: 33 U/L — ABNORMAL LOW (ref 38–126)
Anion gap: 6 (ref 5–15)
BUN: 7 mg/dL (ref 6–20)
CO2: 19 mmol/L — ABNORMAL LOW (ref 22–32)
Calcium: 8.5 mg/dL — ABNORMAL LOW (ref 8.9–10.3)
Chloride: 113 mmol/L — ABNORMAL HIGH (ref 98–111)
Creatinine, Ser: 0.56 mg/dL (ref 0.44–1.00)
GFR, Estimated: >60 mL/min (ref >60)
Glucose, Bld: 130 mg/dL — ABNORMAL HIGH (ref 70–99)
Potassium: 3.8 mmol/L (ref 3.5–5.1)
Sodium: 138 mmol/L (ref 135–145)
Total Bilirubin: 4.3 mg/dL — ABNORMAL HIGH (ref 0.3–1.2)
Total Protein: 5.5 g/dL — ABNORMAL LOW (ref 6.5–8.1)

## 2021-09-16 LAB — MAGNESIUM: Magnesium: 1.7 mg/dL (ref 1.7–2.4)

## 2021-09-16 NOTE — Progress Notes (Signed)
Patient ID: Ashley Owen, female   DOB: 1975-02-17, 46 y.o.   MRN: [id number] FACULTY PRACTICE ANTEPARTUM(COMPREHENSIVE) NOTE  Ashley Owen is a 46 y.o. G4P3000 with Estimated Date of Delivery: 03/27/22   By  early ultrasound [redacted]w[redacted]d  who is admitted for gallbladder attack.    Fetal presentation is  na . Length of Stay:  2  Days  Date of admission:09/14/2021  Subjective: Feels better, tolerated diet, low fat Patient reports the fetal movement as  na . Patient reports uterine contraction  activity as none. Patient reports  vaginal bleeding as none. Patient describes fluid per vagina as None.  Vitals:  Blood pressure 111/73, pulse 84, temperature 98.1 F (36.7 C), temperature source Oral, resp. rate 16, height 5\' 5"  (1.651 m), weight 87 kg, SpO2 100 %. Vitals:   09/15/21 1518 09/15/21 2015 09/16/21 0731 09/16/21 1229  BP: 105/66 102/63 119/67 111/73  Pulse: 79 90 80 84  Resp: 18 16 16    Temp: 97.6 F (36.4 C) 98.3 F (36.8 C) 98 F (36.7 C) 98.1 F (36.7 C)  TempSrc: Oral Oral Oral Oral  SpO2: 99% 100%    Weight:      Height:       Physical Examination:  General appearance - alert, well appearing, and in no distress Abdomen - soft, nontender, nondistended, no masses or organomegaly  Fetal Monitoring:    Labs:  Results for orders placed or performed during the hospital encounter of 09/14/21 (from the past 24 hour(s))  Comprehensive metabolic panel   Collection Time: 09/16/21  6:22 AM  Result Value Ref Range   Sodium 138 135 - 145 mmol/L   Potassium 3.8 3.5 - 5.1 mmol/L   Chloride 113 (H) 98 - 111 mmol/L   CO2 19 (L) 22 - 32 mmol/L   Glucose, Bld 130 (H) 70 - 99 mg/dL   BUN 7 6 - 20 mg/dL   Creatinine, Ser 11/14/21 0.44 - 1.00 mg/dL   Calcium 8.5 (L) 8.9 - 10.3 mg/dL   Total Protein 5.5 (L) 6.5 - 8.1 g/dL   Albumin 2.6 (L) 3.5 - 5.0 g/dL   AST 50 (H) 15 - 41 U/L   ALT 83 (H) 0 - 44 U/L   Alkaline Phosphatase 33 (L) 38 - 126 U/L   Total Bilirubin 4.3 (H) 0.3 - 1.2 mg/dL    GFR, Estimated 09/18/21 1.19 mL/min   Anion gap 6 5 - 15  Magnesium   Collection Time: 09/16/21  6:22 AM  Result Value Ref Range   Magnesium 1.7 1.7 - 2.4 mg/dL    Imaging Studies:    >78 OB Comp Less 14 Wks  Result Date: 09/15/2021 CLINICAL DATA:  Dating EXAM: OBSTETRIC <14 WK ULTRASOUND TECHNIQUE: Transabdominal ultrasound was performed for evaluation of the gestation as well as the maternal uterus and adnexal regions. COMPARISON:  None Available. FINDINGS: Intrauterine gestational sac: Single Yolk sac:  Not Visualized. Embryo:  Visualized. Cardiac Activity: Visualized. Heart Rate: 167 bpm MSD:    mm    w     d CRL:   55.2 mm mm   12 w 1 d                  [place] EDC: 03/29/2022 Subchorionic hemorrhage:  None visualized. Maternal uterus/adnexae: Unremarkable.  No free fluid. IMPRESSION: Single intrauterine gestation. Crown-rump length of 55.2 mm compatible with 12 week 1 day gestational age. Fetal heart rate was visualized at 167 beats per minute. No unexpected finding. Electronically  Signed   By: Minerva Fester M.D.   On: 09/15/2021 01:18   US Abdomen Limited RUQ (LIVER/GB)  Result Date: 09/14/2021 CLINICAL DATA:  Right upper quadrant pain nausea vomiting EXAM: ULTRASOUND ABDOMEN LIMITED RIGHT UPPER QUADRANT COMPARISON:  CT abdomen pelvis 04/24/2019 FINDINGS: Gallbladder: Gallstones measuring up to 10 mm. Gallbladder sludge. No gallbladder wall thickening. Negative surround graphic Murphy sign Common bile duct: Diameter: 3.4 mm Liver: Diffusely increased echogenicity liver without focal liver lesion. Portal vein is patent on color Doppler imaging with normal direction of blood flow towards the liver. Other: None IMPRESSION: Cholelithiasis and gallbladder sludge. Negative sonographic Murphy sign and no biliary dilatation Fatty liver Electronically Signed   By: Marlan Palau M.D.   On: 09/14/2021 18:40     Medications:  Scheduled  pantoprazole (PROTONIX) IV  40 mg Intravenous QHS   I have reviewed the  patient's current medications.  ASSESSMENT: G4P3000 [redacted]w[redacted]d Estimated Date of Delivery: 03/27/22  Cholelithiasis without cholecystitis, no evidence of CBD stone, improving  PLAN: Continue low fat diet throughout pregnancy and hopefully as her HG improves her GB symtpoms will as well  Lazaro Arms 09/16/2021,5:03 PM

## 2021-09-16 NOTE — Progress Notes (Signed)
Progress Note     Subjective: Still with emesis. States it hurts in her pelvis when she throws up. Not having worsening RUQ pain with drinking. Drank some fulls yesterday but did not keep them down well. States her hyperemesis usually resolves by month 3 of pregnancy (she has had 2 prior pregnancies with this issue)  Due to language barrier, an interpreter was present during the history-taking and subsequent discussion (and for part of the physical exam) with this patient.  Objective: Vital signs in last 24 hours: Temp:  [97.6 F (36.4 C)-98.3 F (36.8 C)] 98 F (36.7 C) (09/14 0731) Pulse Rate:  [79-90] 80 (09/14 0731) Resp:  [16-18] 16 (09/14 0731) BP: (102-119)/(63-67) 119/67 (09/14 0731) SpO2:  [99 %-100 %] 100 % (09/13 2015)    Intake/Output from previous day: 09/13 0701 - 09/14 0700 In: 835.1 [I.V.:785.1; IV Piggyback:50] Out: 120 [Emesis/NG output:120] Intake/Output this shift: No intake/output data recorded.  PE: General: pleasant, WD, female who is laying in bed in NAD HEENT: head is normocephalic, atraumatic.  Sclera are noninjected.  Pupils equal and round. EOMs intact.  Ears and nose without any masses or lesions.  Mouth is pink and moist Lungs:  Respiratory effort nonlabored Abd: soft, ND, +BS, mild TTP in RUQ and right lower abdomen MSK: all 4 extremities are symmetrical with no cyanosis, clubbing, or edema. Skin: warm and dry with no masses, lesions, or rashes Neuro: Cranial nerves 2-12 grossly intact, sensation is normal throughout Psych: A&Ox3 with an appropriate affect.    Lab Results:  Recent Labs    09/14/21 1641 09/15/21 0448  WBC 6.1 5.2  HGB 12.2 10.1*  HCT 36.9 31.0*  PLT 268 204   BMET Recent Labs    09/15/21 0448 09/16/21 0622  NA 139 138  K 3.0* 3.8  CL 107 113*  CO2 22 19*  GLUCOSE 91 130*  BUN 9 7  CREATININE 0.66 0.56  CALCIUM 8.4* 8.5*   PT/INR No results for input(s): "LABPROT", "INR" in the last 72 hours. CMP      Component Value Date/Time   NA 138 09/16/2021 0622   K 3.8 09/16/2021 0622   CL 113 (H) 09/16/2021 0622   CO2 19 (L) 09/16/2021 0622   GLUCOSE 130 (H) 09/16/2021 0622   BUN 7 09/16/2021 0622   CREATININE 0.56 09/16/2021 0622   CALCIUM 8.5 (L) 09/16/2021 0622   PROT 5.5 (L) 09/16/2021 0622   ALBUMIN 2.6 (L) 09/16/2021 0622   AST 50 (H) 09/16/2021 0622   ALT 83 (H) 09/16/2021 0622   ALKPHOS 33 (L) 09/16/2021 0622   BILITOT 4.3 (H) 09/16/2021 0622   GFRNONAA >60 09/16/2021 0622   GFRAA >60 04/24/2019 0023   Lipase     Component Value Date/Time   LIPASE 39 09/15/2021 0448       Studies/Results: US OB Comp Less 14 Wks  Result Date: 09/15/2021 CLINICAL DATA:  Dating EXAM: OBSTETRIC <14 WK ULTRASOUND TECHNIQUE: Transabdominal ultrasound was performed for evaluation of the gestation as well as the maternal uterus and adnexal regions. COMPARISON:  None Available. FINDINGS: Intrauterine gestational sac: Single Yolk sac:  Not Visualized. Embryo:  Visualized. Cardiac Activity: Visualized. Heart Rate: 167 bpm MSD:    mm    w     d CRL:   55.2 mm mm   12 w 1 d                  [place] EDC: 03/29/2022 Subchorionic hemorrhage:  None visualized.  Maternal uterus/adnexae: Unremarkable.  No free fluid. IMPRESSION: Single intrauterine gestation. Crown-rump length of 55.2 mm compatible with 12 week 1 day gestational age. Fetal heart rate was visualized at 167 beats per minute. No unexpected finding. Electronically Signed   By: Minerva Fester M.D.   On: 09/15/2021 01:18   US Abdomen Limited RUQ (LIVER/GB)  Result Date: 09/14/2021 CLINICAL DATA:  Right upper quadrant pain nausea vomiting EXAM: ULTRASOUND ABDOMEN LIMITED RIGHT UPPER QUADRANT COMPARISON:  CT abdomen pelvis 04/24/2019 FINDINGS: Gallbladder: Gallstones measuring up to 10 mm. Gallbladder sludge. No gallbladder wall thickening. Negative surround graphic Murphy sign Common bile duct: Diameter: 3.4 mm Liver: Diffusely increased echogenicity liver  without focal liver lesion. Portal vein is patent on color Doppler imaging with normal direction of blood flow towards the liver. Other: None IMPRESSION: Cholelithiasis and gallbladder sludge. Negative sonographic Murphy sign and no biliary dilatation Fatty liver Electronically Signed   By: Marlan Palau M.D.   On: 09/14/2021 18:40    Anti-infectives: Anti-infectives (From admission, onward)    None        Assessment/Plan Pregnant - [redacted] weeks gestation Hyperemesis Cholelithiasis -  fractionated bilirubin yesterday more consistent with indirect etiology rather than biliary obstruction. She may have some element of biliary colic contributing but do not think she has an acute infection  - still with emesis this am in setting of above (pregnancy/hyperemesis) but does not have RUQ pain with PO intake. Okay for gallbladder/lowfat diet and no acute surgical intervention indicated at this time. Consider elective cholecystectomy after delivery   FEN: FLD ID: none needed VTE: none currently  I reviewed last 24 h vitals and pain scores, last 48 h intake and output, last 24 h labs and trends, and last 24 h imaging results.   LOS: 2 days   Eric Form, [facility] 09/16/2021, 10:33 AM Please see Amion for pager number during day hours 7:00am-4:30pm

## 2021-09-17 LAB — COMPREHENSIVE METABOLIC PANEL
ALT: 84 U/L — ABNORMAL HIGH (ref 0–44)
AST: 46 U/L — ABNORMAL HIGH (ref 15–41)
Albumin: 2.8 g/dL — ABNORMAL LOW (ref 3.5–5.0)
Alkaline Phosphatase: 38 U/L (ref 38–126)
Anion gap: 7 (ref 5–15)
BUN: 5 mg/dL — ABNORMAL LOW (ref 6–20)
CO2: 19 mmol/L — ABNORMAL LOW (ref 22–32)
Calcium: 8.8 mg/dL — ABNORMAL LOW (ref 8.9–10.3)
Chloride: 111 mmol/L (ref 98–111)
Creatinine, Ser: 0.6 mg/dL (ref 0.44–1.00)
GFR, Estimated: >60 mL/min (ref >60)
Glucose, Bld: 106 mg/dL — ABNORMAL HIGH (ref 70–99)
Potassium: 4.9 mmol/L (ref 3.5–5.1)
Sodium: 137 mmol/L (ref 135–145)
Total Bilirubin: 2.4 mg/dL — ABNORMAL HIGH (ref 0.3–1.2)
Total Protein: 5.7 g/dL — ABNORMAL LOW (ref 6.5–8.1)

## 2021-09-17 MED ORDER — ALUM & MAG HYDROXIDE-SIMETH 200-200-20 MG/5ML PO SUSP
30.0000 mL | ORAL | Status: DC | PRN
  Administered 2021-09-17: 30 mL via ORAL
  Filled 2021-09-17: qty 30

## 2021-09-17 MED ORDER — METHYLPREDNISOLONE SODIUM SUCC 125 MG IJ SOLR
125.0000 mg | Freq: Two times a day (BID) | INTRAMUSCULAR | Status: DC
  Administered 2021-09-17 – 2021-09-22 (×11): 125 mg via INTRAVENOUS
  Filled 2021-09-17 (×11): qty 2

## 2021-09-17 MED ORDER — GLYCOPYRROLATE 1 MG PO TABS
2.0000 mg | ORAL_TABLET | Freq: Three times a day (TID) | ORAL | Status: DC
  Administered 2021-09-17 – 2021-09-23 (×19): 2 mg via ORAL
  Filled 2021-09-17 (×19): qty 2

## 2021-09-17 MED ORDER — GLYCOPYRROLATE 0.2 MG/ML IJ SOLN
0.2000 mg | Freq: Once | INTRAMUSCULAR | Status: AC
  Administered 2021-09-17: 0.2 mg via INTRAVENOUS
  Filled 2021-09-17: qty 1

## 2021-09-17 MED ORDER — ONDANSETRON 4 MG PO TBDP
8.0000 mg | ORAL_TABLET | Freq: Two times a day (BID) | ORAL | Status: DC
  Administered 2021-09-17 – 2021-09-23 (×13): 8 mg via ORAL
  Filled 2021-09-17 (×13): qty 2

## 2021-09-17 NOTE — Progress Notes (Addendum)
Patient ID: Ashley Owen, female   DOB: Sep 12, 1975, 46 y.o.   MRN: [id number] FACULTY PRACTICE ANTEPARTUM(COMPREHENSIVE) NOTE  Ashley Owen is a 46 y.o. G4P3000 with Estimated Date of Delivery: 03/27/22   By  early ultrasound 102w5d  who is admitted for gallbladder attack.    Fetal presentation is  na . Length of Stay:  3  Days  Date of admission:09/14/2021  Subjective: A bit worse today, recheck LFTs Begin high dose steroid, robinol, OTC ODT zofran Patient reports the fetal movement as  na . Patient reports uterine contraction  activity as none. Patient reports  vaginal bleeding as none. Patient describes fluid per vagina as None.  Vitals:  Blood pressure 114/73, pulse 82, temperature 97.8 F (36.6 C), temperature source Oral, resp. rate 16, height 5\' 5"  (1.651 m), weight 87 kg, SpO2 98 %. Vitals:   09/16/21 0731 09/16/21 1229 09/16/21 1959 09/17/21 0900  BP: 119/67 111/73 (!) 113/57 114/73  Pulse: 80 84 86 82  Resp: 16   16  Temp: 98 F (36.7 C) 98.1 F (36.7 C) 98.4 F (36.9 C) 97.8 F (36.6 C)  TempSrc: Oral Oral Oral Oral  SpO2:    98%  Weight:      Height:       Physical Examination:  General appearance - alert, well appearing, and in no distress Abdomen - soft, nontender, nondistended, no masses or organomegaly No RUQ tenderness  Fetal Monitoring:    Labs:  No results found for this or any previous visit (from the past 24 hour(s)).   Imaging Studies:    05-25-2000 OB Comp Less 14 Wks  Result Date: 09/15/2021 CLINICAL DATA:  Dating EXAM: OBSTETRIC <14 WK ULTRASOUND TECHNIQUE: Transabdominal ultrasound was performed for evaluation of the gestation as well as the maternal uterus and adnexal regions. COMPARISON:  None Available. FINDINGS: Intrauterine gestational sac: Single Yolk sac:  Not Visualized. Embryo:  Visualized. Cardiac Activity: Visualized. Heart Rate: 167 bpm MSD:    mm    w     d CRL:   55.2 mm mm   12 w 1 d                  09/17/2021 EDC: 03/29/2022 Subchorionic  hemorrhage:  None visualized. Maternal uterus/adnexae: Unremarkable.  No free fluid. IMPRESSION: Single intrauterine gestation. Crown-rump length of 55.2 mm compatible with 12 week 1 day gestational age. Fetal heart rate was visualized at 167 beats per minute. No unexpected finding. Electronically Signed   By: 03/31/2022 M.D.   On: 09/15/2021 01:18   09/17/2021 Abdomen Limited RUQ (LIVER/GB)  Result Date: 09/14/2021 CLINICAL DATA:  Right upper quadrant pain nausea vomiting EXAM: ULTRASOUND ABDOMEN LIMITED RIGHT UPPER QUADRANT COMPARISON:  CT abdomen pelvis 04/24/2019 FINDINGS: Gallbladder: Gallstones measuring up to 10 mm. Gallbladder sludge. No gallbladder wall thickening. Negative surround graphic Murphy sign Common bile duct: Diameter: 3.4 mm Liver: Diffusely increased echogenicity liver without focal liver lesion. Portal vein is patent on color Doppler imaging with normal direction of blood flow towards the liver. Other: None IMPRESSION: Cholelithiasis and gallbladder sludge. Negative sonographic Murphy sign and no biliary dilatation Fatty liver Electronically Signed   By: 04/26/2019 M.D.   On: 09/14/2021 18:40     Medications:  Scheduled  glycopyrrolate  0.2 mg Intravenous Once   glycopyrrolate  2 mg Oral TID   methylPREDNISolone (SOLU-MEDROL) injection  125 mg Intravenous Q12H   ondansetron  8 mg Oral Q12H   pantoprazole (PROTONIX) IV  40 mg Intravenous QHS   I have reviewed the patient's current medications.  ASSESSMENT: G4P3000 [redacted]w[redacted]d Estimated Date of Delivery: 03/27/22  Cholelithiasis without cholecystitis, no evidence of CBD stone, improving Ptyalism HG  PLAN: Begin high dose steroids, OTC zofran and robinol Recheck LFTs  Lazaro Arms 09/17/2021,9:27 AM    Patient ID: Ashley Owen, female   DOB: 07-Mar-1975, 46 y.o.   MRN: [id number]

## 2021-09-17 NOTE — Progress Notes (Addendum)
Total bilirubin improved. General surgery will remain available as needed. I have placed follow up information in her chart to evaluate for laparoscopic cholecystectomy following delivery.

## 2021-09-18 MED ORDER — SODIUM CHLORIDE 0.9 % IV SOLN
INTRAVENOUS | Status: DC
  Administered 2021-09-22 – 2021-09-23 (×2): 100 mL/h via INTRAVENOUS

## 2021-09-18 NOTE — Progress Notes (Signed)
Patient ID: Ashley Owen, female   DOB: [birth year], 46 y.o.   MRN: [id number] FACULTY PRACTICE ANTEPARTUM(COMPREHENSIVE) NOTE  Ashley Owen is a 46 y.o. G4P3000 with Estimated Date of Delivery: 03/27/22   By  early ultrasound [redacted]w[redacted]d  who is admitted for gallbladder attack and hyperemesis gravidarum + ptylism.    Fetal presentation is  n/a . Length of Stay:  4  Days  Date of admission:09/14/2021  Subjective: Pt feels better, threw up x 2 since starting steroids Overall improved Patient reports the fetal movement as na. Patient reports uterine contraction  activity as none. Patient reports  vaginal bleeding as none. Patient describes fluid per vagina as None.  Vitals:  Blood pressure (!) 103/54, pulse (!) 101, temperature 97.6 F (36.4 C), temperature source Oral, resp. rate 18, height 5\' 5"  (1.651 m), weight 87 kg, SpO2 99 %. Vitals:   09/17/21 2004 09/18/21 0406 09/18/21 0809 09/18/21 0810  BP: 111/66 108/63  (!) 103/54  Pulse: 87 90  (!) 101  Resp: 16 16  18   Temp: 98.9 F (37.2 C) 98.2 F (36.8 C)  97.6 F (36.4 C)  TempSrc: Oral Oral  Oral  SpO2: 100% 98% 99% 99%  Weight:      Height:       Physical Examination:  General appearance - alert, well appearing, and in no distress Abdomen - soft, nontender, nondistended, no masses or organomegaly specifically RUQ is negative  Fetal Monitoring:  FHR 160s  Labs: No new labs Results for orders placed or performed during the hospital encounter of 09/14/21 (from the past 24 hour(s))  Comprehensive metabolic panel   Collection Time: 09/17/21  9:50 AM  Result Value Ref Range   Sodium 137 135 - 145 mmol/L   Potassium 4.9 3.5 - 5.1 mmol/L   Chloride 111 98 - 111 mmol/L   CO2 19 (L) 22 - 32 mmol/L   Glucose, Bld 106 (H) 70 - 99 mg/dL   BUN 5 (L) 6 - 20 mg/dL   Creatinine, Ser 11/14/21 0.44 - 1.00 mg/dL   Calcium 8.8 (L) 8.9 - 10.3 mg/dL   Total Protein 5.7 (L) 6.5 - 8.1 g/dL   Albumin 2.8 (L) 3.5 - 5.0 g/dL   AST 46 (H) 15 - 41 U/L    ALT 84 (H) 0 - 44 U/L   Alkaline Phosphatase 38 38 - 126 U/L   Total Bilirubin 2.4 (H) 0.3 - 1.2 mg/dL   GFR, Estimated 09/19/21 3.61 mL/min   Anion gap 7 5 - 15    Imaging Studies:    >44 OB Comp Less 14 Wks  Result Date: 09/15/2021 CLINICAL DATA:  Dating EXAM: OBSTETRIC <14 WK ULTRASOUND TECHNIQUE: Transabdominal ultrasound was performed for evaluation of the gestation as well as the maternal uterus and adnexal regions. COMPARISON:  None Available. FINDINGS: Intrauterine gestational sac: Single Yolk sac:  Not Visualized. Embryo:  Visualized. Cardiac Activity: Visualized. Heart Rate: 167 bpm MSD:    mm    w     d CRL:   55.2 mm mm   12 w 1 d                  [place] EDC: 03/29/2022 Subchorionic hemorrhage:  None visualized. Maternal uterus/adnexae: Unremarkable.  No free fluid. IMPRESSION: Single intrauterine gestation. Crown-rump length of 55.2 mm compatible with 12 week 1 day gestational age. Fetal heart rate was visualized at 167 beats per minute. No unexpected finding. Electronically Signed   By: [place]  M.D.   On: 09/15/2021 01:18   US Abdomen Limited RUQ (LIVER/GB)  Result Date: 09/14/2021 CLINICAL DATA:  Right upper quadrant pain nausea vomiting EXAM: ULTRASOUND ABDOMEN LIMITED RIGHT UPPER QUADRANT COMPARISON:  CT abdomen pelvis 04/24/2019 FINDINGS: Gallbladder: Gallstones measuring up to 10 mm. Gallbladder sludge. No gallbladder wall thickening. Negative surround graphic Murphy sign Common bile duct: Diameter: 3.4 mm Liver: Diffusely increased echogenicity liver without focal liver lesion. Portal vein is patent on color Doppler imaging with normal direction of blood flow towards the liver. Other: None IMPRESSION: Cholelithiasis and gallbladder sludge. Negative sonographic Murphy sign and no biliary dilatation Fatty liver Electronically Signed   By: Franchot Gallo M.D.   On: 09/14/2021 18:40     Medications:  Scheduled  glycopyrrolate  2 mg Oral TID   methylPREDNISolone (SOLU-MEDROL) injection   125 mg Intravenous Q12H   ondansetron  8 mg Oral Q12H   pantoprazole (PROTONIX) IV  40 mg Intravenous QHS   I have reviewed the patient's current medications.  ASSESSMENT: G4P3000 [redacted]w[redacted]d Estimated Date of Delivery: 03/27/22  Patient Active Problem List   Diagnosis Date Noted   AMA (advanced maternal age) multigravida 17+, first trimester 09/15/2021   Supervision of high risk pregnancy, antepartum, first trimester 09/15/2021   BMI 32.0-32.9,adult 09/15/2021   Obesity affecting pregnancy in first trimester 09/15/2021   Rh negative state in antepartum period, first trimester 89/38/1017   Biliary colic 51/02/5850   Cholelithiasis affecting pregnancy in first trimester, antepartum 09/14/2021    PLAN: >continue IV steroids for 48 hours >continue ATC robinul and zofran >Low fat diet for gallbladder pathology  Anticipate discharge tomorrow   Florian Buff 09/18/2021,9:08 AM

## 2021-09-19 ENCOUNTER — Inpatient Hospital Stay (HOSPITAL_COMMUNITY): Payer: BC Managed Care – PPO

## 2021-09-19 LAB — COMPREHENSIVE METABOLIC PANEL
ALT: 219 U/L — ABNORMAL HIGH (ref 0–44)
AST: 109 U/L — ABNORMAL HIGH (ref 15–41)
Albumin: 2.9 g/dL — ABNORMAL LOW (ref 3.5–5.0)
Alkaline Phosphatase: 38 U/L (ref 38–126)
Anion gap: 9 (ref 5–15)
BUN: 7 mg/dL (ref 6–20)
CO2: 21 mmol/L — ABNORMAL LOW (ref 22–32)
Calcium: 9.1 mg/dL (ref 8.9–10.3)
Chloride: 105 mmol/L (ref 98–111)
Creatinine, Ser: 0.67 mg/dL (ref 0.44–1.00)
GFR, Estimated: >60 mL/min (ref >60)
Glucose, Bld: 138 mg/dL — ABNORMAL HIGH (ref 70–99)
Potassium: 4.1 mmol/L (ref 3.5–5.1)
Sodium: 135 mmol/L (ref 135–145)
Total Bilirubin: 1.4 mg/dL — ABNORMAL HIGH (ref 0.3–1.2)
Total Protein: 6.1 g/dL — ABNORMAL LOW (ref 6.5–8.1)

## 2021-09-19 LAB — BILIRUBIN, DIRECT: Bilirubin, Direct: 0.6 mg/dL — ABNORMAL HIGH (ref 0.0–0.2)

## 2021-09-19 LAB — T4, FREE: Free T4: 3.5 ng/dL — ABNORMAL HIGH (ref 0.61–1.12)

## 2021-09-19 LAB — LIPASE, BLOOD: Lipase: 79 U/L — ABNORMAL HIGH (ref 11–51)

## 2021-09-19 LAB — AMYLASE: Amylase: 158 U/L — ABNORMAL HIGH (ref 28–100)

## 2021-09-19 LAB — TSH: TSH: 0.021 u[IU]/mL — ABNORMAL LOW (ref 0.350–4.500)

## 2021-09-19 NOTE — Progress Notes (Signed)
Patient ID: Ashley Owen, female   DOB: October 27, 1975, 46 y.o.   MRN: [id number] [place]) NOTE  Ashley Owen is a 46 y.o. G4P3000 with Estimated Date of Delivery: 03/27/22   By  early ultrasound [redacted]w[redacted]d  who is admitted for symptomatic cholelithiasis.    Fetal presentation is na. Length of Stay:  5  Days  Date of admission:09/14/2021  Subjective: Patient actually feels a bit worse today She had true emesis, not heaving with spitting, 6 times yesterday and her RUQ pain is a bit worse. Not as bad as on admission Patient reports the fetal movement as na. Patient reports uterine contraction  activity as none. Patient reports  vaginal bleeding as none. Patient describes fluid per vagina as None.  Vitals:  Blood pressure 101/64, pulse 83, temperature 98 F (36.7 C), temperature source Oral, resp. rate 17, height 5\' 5"  (1.651 m), weight 87 kg, SpO2 99 %. Vitals:   09/18/21 1141 09/18/21 1506 09/18/21 2123 09/19/21 0848  BP: 110/62 115/76 94/61 101/64  Pulse: 83 87 93 83  Resp: 18 18 16 17   Temp: 98.1 F (36.7 C) 98.3 F (36.8 C) 98.2 F (36.8 C) 98 F (36.7 C)  TempSrc: Oral Oral Oral Oral  SpO2: 100% 100% 100% 99%  Weight:      Height:       Physical Examination:  General appearance - alert, well appearing, and in no distress Abdomen - soft some RUQ tenderness, mild, no rebound Fundal Height:   na    Labs:  Results for orders placed or performed during the hospital encounter of 09/14/21 (from the past 24 hour(s))  TSH   Collection Time: 09/19/21  9:01 AM  Result Value Ref Range   TSH 0.021 (L) 0.350 - 4.500 uIU/mL  Comprehensive metabolic panel   Collection Time: 09/19/21  9:01 AM  Result Value Ref Range   Sodium 135 135 - 145 mmol/L   Potassium 4.1 3.5 - 5.1 mmol/L   Chloride 105 98 - 111 mmol/L   CO2 21 (L) 22 - 32 mmol/L   Glucose, Bld 138 (H) 70 - 99 mg/dL   BUN 7 6 - 20 mg/dL   Creatinine, Ser 0.67 0.44 - 1.00 mg/dL   Calcium 9.1 8.9 -  10.3 mg/dL   Total Protein 6.1 (L) 6.5 - 8.1 g/dL   Albumin 2.9 (L) 3.5 - 5.0 g/dL   AST 109 (H) 15 - 41 U/L   ALT 219 (H) 0 - 44 U/L   Alkaline Phosphatase 38 38 - 126 U/L   Total Bilirubin 1.4 (H) 0.3 - 1.2 mg/dL   GFR, Estimated >60 >60 mL/min   Anion gap 9 5 - 15  Bilirubin, direct   Collection Time: 09/19/21  9:01 AM  Result Value Ref Range   Bilirubin, Direct 0.6 (H) 0.0 - 0.2 mg/dL  Amylase   Collection Time: 09/19/21  9:01 AM  Result Value Ref Range   Amylase 158 (H) 28 - 100 U/L  Lipase, blood   Collection Time: 09/19/21  9:01 AM  Result Value Ref Range   Lipase 79 (H) 11 - 51 U/L      Latest Ref Rng & Units 09/19/2021    9:01 AM 09/17/2021    9:50 AM 09/16/2021    6:22 AM  CMP  Glucose 70 - 99 mg/dL 138  106  130   BUN 6 - 20 mg/dL 7  5  7    Creatinine 0.44 - 1.00 mg/dL 0.67  0.60  0.56   Sodium 135 - 145 mmol/L 135  137  138   Potassium 3.5 - 5.1 mmol/L 4.1  4.9  3.8   Chloride 98 - 111 mmol/L 105  111  113   CO2 22 - 32 mmol/L 21  19  19    Calcium 8.9 - 10.3 mg/dL 9.1  8.8  8.5   Total Protein 6.5 - 8.1 g/dL 6.1  5.7  5.5   Total Bilirubin 0.3 - 1.2 mg/dL 1.4  2.4  4.3   Alkaline Phos 38 - 126 U/L 38  38  33   AST 15 - 41 U/L 109  46  50   ALT 0 - 44 U/L 219  84  83     Lipase       79(9/17)       39(9/12)  Amylase  158(9/17)       92(9/13)      Imaging Studies:    No results found.   Medications:  Scheduled  glycopyrrolate  2 mg Oral TID   methylPREDNISolone (SOLU-MEDROL) injection  125 mg Intravenous Q12H   ondansetron  8 mg Oral Q12H   pantoprazole (PROTONIX) IV  40 mg Intravenous QHS   I have reviewed the patient's current medications.  ASSESSMENT: G4P3000 [redacted]w[redacted]d Estimated Date of Delivery: 03/27/22  Cholelithiasis, re elevation LFTs and lipase/amylase but improved bilirubin Hyperemesis gravidarum + ptyalism Elevated TSH, TFTs pending  PLAN: Clinically the patient is a little worse today, and especially not responding more favorably to the  high dose steroid therapy, robinul, and ATC zofran, I was suspicious for increased gallbladder involvement.  The LFTs have nearly tripled and the lipase amylase are elevated now.  The bilirubin has improved so not a completely consistent picture.  However, given this picture, evaluation of the CBD is warranted and a MRCP, without contrast is ordered, to evaluate for choledocholithiasis.  If indeed that is the case would need GI consult for ERCP retrieval and, after recovery, lap chole.   Will re engage general surgery  after MRCP results return  03/29/22 09/19/2021,10:20 AM

## 2021-09-20 ENCOUNTER — Encounter (HOSPITAL_COMMUNITY): Payer: Self-pay | Admitting: Obstetrics and Gynecology

## 2021-09-20 NOTE — Progress Notes (Signed)
FACULTY PRACTICE ANTEPARTUM(COMPREHENSIVE) NOTE  Ashley Owen is a 46 y.o. [phone] with Estimated Date of Delivery: 03/27/22   By  early ultrasound 7816w1d  who is admitted for symptomatic cholelithiasis.    Length of Stay:  6  Days  Date of admission:09/14/2021  Subjective:  Interpreter used for visit Pt feeling better today, sitting up in bed eating an apple.  States she is feeling hungry this am.  Pt reports vomiting x 5; however, per RN notes no emesis overnight rather continued spitting.  Denies abdominal pain today.  No fever/chills.    Patient reports the fetal movement as  n/a . Patient reports uterine contraction  activity as none. Patient reports  vaginal bleeding as none. Patient describes fluid per vagina as None.  Vitals:  Blood pressure 118/67, pulse 79, temperature 98 F (36.7 C), temperature source Oral, resp. rate 18, height 5\' 5"  (1.651 m), weight 87 kg, SpO2 98 %. Vitals:   09/19/21 2019 09/20/21 0040 09/20/21 0344 09/20/21 0747  BP: (!) 105/51 110/71 126/67 118/67  Pulse: 87 87 81 79  Resp: 18 20 (!) 21 18  Temp: 98.2 F (36.8 C) 97.9 F (36.6 C) 98.8 F (37.1 C) 98 F (36.7 C)  TempSrc: Oral Oral Oral Oral  SpO2: 100% 100% 97% 98%  Weight:      Height:       Physical Examination:  General appearance - alert, well appearing, and in no distress Mental status - normal mood, behavior, speech, dress, motor activity, and thought processes Chest - CTAB Heart - normal rate and regular rhythm Abdomen - obese, soft and non-tender, no rebound, no guarding Musculoskeletal - no calf tenderness bilaterally Extremities - no edema Skin - warm and dry   Imaging Studies:    MR ABDOMEN MRCP WO CONTRAST  Result Date: 09/19/2021 CLINICAL DATA:  Elevated LFTs. Thirteen weeks pregnant. Gallstones. EXAM: MRI ABDOMEN WITHOUT CONTRAST  (INCLUDING MRCP) TECHNIQUE: Multiplanar multisequence MR imaging of the abdomen was performed. Heavily T2-weighted images of the biliary and  pancreatic ducts were obtained, and three-dimensional MRCP images were rendered by post processing. COMPARISON:  Ultrasound 09/14/2021 and CT AP 02/07/2017 FINDINGS: Lower chest: No acute findings. Hepatobiliary: No focal liver abnormality. Stones identified within the gallbladder which measure up to 6 mm. No gall bladder wall thickening, pericholecystic inflammation or fluid. Normal caliber common bile duct. This measures up to 4 mm, image 18/12. No signs of choledocholithiasis. No significant intrahepatic bile duct dilatation. Pancreas: No mass, inflammatory changes, or other parenchymal abnormality identified. Spleen:  Within normal limits in size and appearance. Adrenals/Urinary Tract: No masses identified. No evidence of hydronephrosis. Stomach/Bowel: Stomach appears normal.  No dilated loops of bowel. Vascular/Lymphatic: Normal caliber abdominal aorta. No adenopathy identified. Other:  No free fluid or fluid collections within the abdomen. Musculoskeletal: No suspicious bone lesions identified. IMPRESSION: 1. Gallstones. 2. No significant biliary duct dilatation or evidence of choledocholithiasis. 3. No significant inflammatory changes of the pancreas noted at this time. Electronically Signed   By: Signa Kellaylor  [place] M.D.   On: 09/19/2021 13:24     Medications:  Scheduled  glycopyrrolate  2 mg Oral TID   methylPREDNISolone (SOLU-MEDROL) injection  125 mg Intravenous Q12H   ondansetron  8 mg Oral Q12H   pantoprazole (PROTONIX) IV  40 mg Intravenous QHS   I have reviewed the patient's current medications.  ASSESSMENT: [id number] 6016w1d Estimated Date of Delivery: 03/27/22  Patient Active Problem List   Diagnosis Date Noted   AMA (advanced maternal  age) multigravida 44+, first trimester 09/15/2021   Supervision of high risk pregnancy, antepartum, first trimester 09/15/2021   BMI 32.0-32.9,adult 09/15/2021   Obesity affecting pregnancy in first trimester 09/15/2021   Rh negative state in antepartum  period, first trimester 74/14/2395   Biliary colic 32/02/3341   Cholelithiasis affecting pregnancy in first trimester, antepartum 09/14/2021    PLAN: 1) Symptomatic Cholelithiasis  -clinically improving and MRCP negative -continue scheduled zofran, protonix, steroid taper and Robinul -will advance diet as tolerated.  Once tolerating diet will transition to po medication -reviewed with patient better diet choices to help prevent recurrence of symptoms -plan for repeat lab work tomorrow  2) Abnormal thyroid labs -concern for hyperthyroid vs reactive changes from HG, plan to recheck in a few weeks  DISP: Inpatient management as outlined above.  If pt tolerating po medication and diet, possible discharge home tomorrow  Janyth Pupa, DO Attending [place], [place] for [employer], [place]

## 2021-09-21 DIAGNOSIS — Z3A13 13 weeks gestation of pregnancy: Secondary | ICD-10-CM

## 2021-09-21 DIAGNOSIS — O210 Mild hyperemesis gravidarum: Secondary | ICD-10-CM

## 2021-09-21 LAB — T3 UPTAKE: T3 Uptake Ratio: 41 % — ABNORMAL HIGH (ref 24–39)

## 2021-09-21 LAB — COMPREHENSIVE METABOLIC PANEL
ALT: 245 U/L — ABNORMAL HIGH (ref 0–44)
AST: 66 U/L — ABNORMAL HIGH (ref 15–41)
Albumin: 2.6 g/dL — ABNORMAL LOW (ref 3.5–5.0)
Alkaline Phosphatase: 32 U/L — ABNORMAL LOW (ref 38–126)
Anion gap: 9 (ref 5–15)
BUN: 7 mg/dL (ref 6–20)
CO2: 20 mmol/L — ABNORMAL LOW (ref 22–32)
Calcium: 8.6 mg/dL — ABNORMAL LOW (ref 8.9–10.3)
Chloride: 106 mmol/L (ref 98–111)
Creatinine, Ser: 0.56 mg/dL (ref 0.44–1.00)
GFR, Estimated: >60 mL/min (ref >60)
Glucose, Bld: 160 mg/dL — ABNORMAL HIGH (ref 70–99)
Potassium: 3.5 mmol/L (ref 3.5–5.1)
Sodium: 135 mmol/L (ref 135–145)
Total Bilirubin: 1.2 mg/dL (ref 0.3–1.2)
Total Protein: 5.4 g/dL — ABNORMAL LOW (ref 6.5–8.1)

## 2021-09-21 LAB — T4: T4, Total: 24.3 ug/dL — ABNORMAL HIGH (ref 4.5–12.0)

## 2021-09-21 LAB — HEMOGLOBIN A1C
Hgb A1c MFr Bld: 4.6 % — ABNORMAL LOW (ref 4.8–5.6)
Mean Plasma Glucose: 85.32 mg/dL

## 2021-09-21 LAB — LIPASE, BLOOD: Lipase: 87 U/L — ABNORMAL HIGH (ref 11–51)

## 2021-09-21 LAB — AMYLASE: Amylase: 152 U/L — ABNORMAL HIGH (ref 28–100)

## 2021-09-21 NOTE — Progress Notes (Signed)
Patient ID: Ashley Owen, female   DOB: 17-Jul-1975, 46 y.o.   MRN: [id number] [place] COMPREHENSIVE PROGRESS NOTE  Ashley Owen is a 46 y.o. [phone] at [redacted]w[redacted]d  who is admitted for symptomatic cholelithiais and hyper emesis.   Fetal presentation is unsure. Length of Stay:  7  Days  Subjective: Pt reports feeling some better today. Tolerating liquids. No N/V since yesterday  Vitals:  Blood pressure 114/68, pulse 89, temperature 98.1 F (36.7 C), temperature source Oral, resp. rate 15, height 5\' 5"  (1.651 m), weight 87 kg, SpO2 98 %.  Physical Examination: Lungs clear Heart RRR Abd soft + BS Ext non tender  Fetal Monitoring:   + FHT's  Labs:  Results for orders placed or performed during the hospital encounter of 09/14/21 (from the past 24 hour(s))  Comprehensive metabolic panel   Collection Time: 09/21/21  6:07 AM  Result Value Ref Range   Sodium 135 135 - 145 mmol/L   Potassium 3.5 3.5 - 5.1 mmol/L   Chloride 106 98 - 111 mmol/L   CO2 20 (L) 22 - 32 mmol/L   Glucose, Bld 160 (H) 70 - 99 mg/dL   BUN 7 6 - 20 mg/dL   Creatinine, Ser 0.56 0.44 - 1.00 mg/dL   Calcium 8.6 (L) 8.9 - 10.3 mg/dL   Total Protein 5.4 (L) 6.5 - 8.1 g/dL   Albumin 2.6 (L) 3.5 - 5.0 g/dL   AST 66 (H) 15 - 41 U/L   ALT 245 (H) 0 - 44 U/L   Alkaline Phosphatase 32 (L) 38 - 126 U/L   Total Bilirubin 1.2 0.3 - 1.2 mg/dL   GFR, Estimated >60 >60 mL/min   Anion gap 9 5 - 15  Amylase   Collection Time: 09/21/21  6:07 AM  Result Value Ref Range   Amylase 152 (H) 28 - 100 U/L  Lipase, blood   Collection Time: 09/21/21  6:07 AM  Result Value Ref Range   Lipase 87 (H) 11 - 51 U/L  Hemoglobin A1c   Collection Time: 09/21/21  6:07 AM  Result Value Ref Range   Hgb A1c MFr Bld 4.6 (L) 4.8 - 5.6 %   Mean Plasma Glucose 85.32 mg/dL    Imaging Studies:    NA   Medications:  Scheduled  glycopyrrolate  2 mg Oral TID   methylPREDNISolone (SOLU-MEDROL) injection  125 mg Intravenous Q12H    ondansetron  8 mg Oral Q12H   pantoprazole (PROTONIX) IV  40 mg Intravenous QHS   I have reviewed the patient's current medications.  ASSESSMENT: IUP 13 2/7 weeks Cholelithiasis Hyper emesis AMA   PLAN: Stable. Tolerating liquids. Labs essentially unchanged. Repeat on Thursday Continue routine antenatal care.   Chancy Milroy 09/21/2021,12:47 PM

## 2021-09-22 MED ORDER — METHYLPREDNISOLONE 4 MG PO TABS: 4.0000 mg | ORAL_TABLET | Freq: Every day | ORAL | Status: DC

## 2021-09-22 MED ORDER — METHYLPREDNISOLONE 32 MG PO TABS: 48.0000 mg | ORAL_TABLET | Freq: Once | ORAL | Status: DC

## 2021-09-22 MED ORDER — METHYLPREDNISOLONE 4 MG PO TABS: 8.0000 mg | ORAL_TABLET | Freq: Every day | ORAL | Status: DC

## 2021-09-22 MED ORDER — METHYLPREDNISOLONE 16 MG PO TABS
16.0000 mg | ORAL_TABLET | Freq: Every day | ORAL | Status: DC
  Administered 2021-09-23: 16 mg via ORAL
  Filled 2021-09-22: qty 1

## 2021-09-22 MED ORDER — METHYLPREDNISOLONE 16 MG PO TABS
16.0000 mg | ORAL_TABLET | Freq: Every day | ORAL | Status: DC
  Filled 2021-09-22: qty 1

## 2021-09-22 NOTE — Progress Notes (Signed)
Initial Nutrition Assessment  DOCUMENTATION CODES:   Obesity unspecified  INTERVENTION:  Soft diet, encourage lower fat foods Snacks TID may be ordered Prenatal vitamins - when able to tolerate  Encourage small frequent meals to improve intake and reduce nausea.  Pt at nutritional risk due to loss of 9 % of usual weight   NUTRITION DIAGNOSIS:   Increased nutrient needs related to   as evidenced by  (pregnancy, excessive weight loss).   GOAL:   Patient will meet greater than or equal to 90% of their needs   MONITOR:   Weight trends  REASON FOR ASSESSMENT:   LOS, Antenatal    ASSESSMENT:   13 3/7 weeks, adm due to symptomatic cholelithiais and hyper emesis. weight at 9 weeks 98.1 kg, BMI 38. 9/7: weight down to 90.3 kg due to n/v.  Currently at a 9.2 kg weight loss ( 20 lbs) Pt at 91 % of usual weight. Emesis improving and is reported to be tolerating soft diet. On steroid taper. Not currently on prenatal vitamins - consider ordering once diet tol well.   Diet Order:   Diet Order             DIET SOFT Room service appropriate? Yes; Fluid consistency: Thin  Diet effective now                   EDUCATION NEEDS:   No education needs have been identified at this time  Skin:  Skin Assessment: Reviewed RN Assessment  Height:   Ht Readings from Last 1 Encounters:  09/14/21 5\' 5"  (1.651 m)    Weight:   Wt Readings from Last 1 Encounters:  09/22/21 88.9 kg    Ideal Body Weight:   125 lbs  BMI:  Body mass index is 32.62 kg/m.  Estimated Nutritional Needs:   Kcal:  22-2400  Protein:  98-108 g  Fluid:  >2.2L

## 2021-09-22 NOTE — Progress Notes (Signed)
Patient ID: Ashley Owen, female   DOB: [birth year], 46 y.o.   MRN: [id number] [place] COMPREHENSIVE PROGRESS NOTE  Ashley Owen is a 46 y.o. [phone] at [redacted]w[redacted]d  who is admitted for symptomatic cholelithiais and hyper emesis.   Fetal presentation is unsure. Length of Stay:  8  Days  Subjective: Pt feels better. Tolerating some diet. N/V has improved and only occs 1-2 times a day now  Vitals:  Blood pressure 116/63, pulse 78, temperature 98 F (36.7 C), temperature source Oral, resp. rate 16, height 5\' 5"  (1.651 m), weight 88.9 kg, SpO2 100 %.  Physical Examination: Lungs clear Heart RRR Abd soft + BS  Fetal Monitoring:   + FHT's  Labs:  No results found for this or any previous visit (from the past 24 hour(s)).  Imaging Studies:    NA   Medications:  Scheduled  glycopyrrolate  2 mg Oral TID   methylPREDNISolone (SOLU-MEDROL) injection  125 mg Intravenous Q12H   ondansetron  8 mg Oral Q12H   pantoprazole (PROTONIX) IV  40 mg Intravenous QHS   I have reviewed the patient's current medications.  ASSESSMENT: IUP 13 3/7 weeks Cholelithiasis AMA  PLAN: Stable. Tolerating diet better today. Will switch to oral steroids. And repeat labs in AM. Hopeful discharge tomorrow.  Continue routine antenatal care.   Chancy Milroy 09/22/2021,11:08 AM

## 2021-09-23 ENCOUNTER — Other Ambulatory Visit (HOSPITAL_COMMUNITY): Payer: Self-pay

## 2021-09-23 LAB — COMPREHENSIVE METABOLIC PANEL
ALT: 150 U/L — ABNORMAL HIGH (ref 0–44)
AST: 27 U/L (ref 15–41)
Albumin: 2.5 g/dL — ABNORMAL LOW (ref 3.5–5.0)
Alkaline Phosphatase: 32 U/L — ABNORMAL LOW (ref 38–126)
Anion gap: 8 (ref 5–15)
BUN: 7 mg/dL (ref 6–20)
CO2: 22 mmol/L (ref 22–32)
Calcium: 8.4 mg/dL — ABNORMAL LOW (ref 8.9–10.3)
Chloride: 105 mmol/L (ref 98–111)
Creatinine, Ser: 0.56 mg/dL (ref 0.44–1.00)
GFR, Estimated: >60 mL/min (ref >60)
Glucose, Bld: 113 mg/dL — ABNORMAL HIGH (ref 70–99)
Potassium: 2.6 mmol/L — CL (ref 3.5–5.1)
Sodium: 135 mmol/L (ref 135–145)
Total Bilirubin: 1.2 mg/dL (ref 0.3–1.2)
Total Protein: 5.1 g/dL — ABNORMAL LOW (ref 6.5–8.1)

## 2021-09-23 LAB — CBC
HCT: 29.8 % — ABNORMAL LOW (ref 36.0–46.0)
Hemoglobin: 10.2 g/dL — ABNORMAL LOW (ref 12.0–15.0)
MCH: 27.9 pg (ref 26.0–34.0)
MCHC: 34.2 g/dL (ref 30.0–36.0)
MCV: 81.6 fL (ref 80.0–100.0)
Platelets: 184 10*3/uL (ref 150–400)
RBC: 3.65 MIL/uL — ABNORMAL LOW (ref 3.87–5.11)
RDW: 12.4 % (ref 11.5–15.5)
WBC: 6 10*3/uL (ref 4.0–10.5)
nRBC: 0 % (ref 0.0–0.2)

## 2021-09-23 MED ORDER — ONDANSETRON 8 MG PO TBDP
8.0000 mg | ORAL_TABLET | Freq: Two times a day (BID) | ORAL | 0 refills | Status: DC
  Filled 2021-09-23: qty 20, 10d supply, fill #0

## 2021-09-23 MED ORDER — POTASSIUM CHLORIDE CRYS ER 20 MEQ PO TBCR: 20.0000 meq | EXTENDED_RELEASE_TABLET | Freq: Every day | ORAL | 0 refills | Status: DC

## 2021-09-23 MED ORDER — PANTOPRAZOLE SODIUM 40 MG PO TBEC: 40.0000 mg | DELAYED_RELEASE_TABLET | Freq: Every day | ORAL | 2 refills | Status: DC

## 2021-09-23 MED ORDER — PROMETHAZINE HCL 25 MG PO TABS
25.0000 mg | ORAL_TABLET | Freq: Four times a day (QID) | ORAL | 2 refills | Status: DC | PRN
  Filled 2021-09-23: qty 30, 8d supply, fill #0

## 2021-09-23 MED ORDER — ONDANSETRON 8 MG PO TBDP: 8.0000 mg | ORAL_TABLET | Freq: Two times a day (BID) | ORAL | 0 refills | Status: DC

## 2021-09-23 MED ORDER — METHYLPREDNISOLONE 4 MG PO TABS
ORAL_TABLET | ORAL | 0 refills | Status: DC
  Filled 2021-09-23: qty 65, 30d supply, fill #0

## 2021-09-23 MED ORDER — POTASSIUM CHLORIDE 10 MEQ/100ML IV SOLN
10.0000 meq | INTRAVENOUS | Status: AC
  Administered 2021-09-23 (×2): 10 meq via INTRAVENOUS
  Filled 2021-09-23 (×2): qty 100

## 2021-09-23 MED ORDER — GLYCOPYRROLATE 1 MG PO TABS
2.0000 mg | ORAL_TABLET | Freq: Three times a day (TID) | ORAL | 1 refills | Status: DC
  Filled 2021-09-23: qty 90, 15d supply, fill #0

## 2021-09-23 MED ORDER — PANTOPRAZOLE SODIUM 40 MG PO TBEC
40.0000 mg | DELAYED_RELEASE_TABLET | Freq: Every day | ORAL | 2 refills | Status: DC
  Filled 2021-09-23: qty 30, 30d supply, fill #0

## 2021-09-23 MED ORDER — PROMETHAZINE HCL 25 MG PO TABS: 25.0000 mg | ORAL_TABLET | Freq: Four times a day (QID) | ORAL | 2 refills | Status: DC | PRN

## 2021-09-23 MED ORDER — METHYLPREDNISOLONE 4 MG PO TABS: ORAL_TABLET | ORAL | 0 refills | Status: DC

## 2021-09-23 MED ORDER — GLYCOPYRROLATE 1 MG PO TABS: 2.0000 mg | ORAL_TABLET | Freq: Three times a day (TID) | ORAL | 1 refills | Status: DC

## 2021-09-23 MED ORDER — POTASSIUM CHLORIDE CRYS ER 20 MEQ PO TBCR
20.0000 meq | EXTENDED_RELEASE_TABLET | Freq: Every day | ORAL | 0 refills | Status: DC
  Filled 2021-09-23: qty 5, 5d supply, fill #0

## 2021-09-23 NOTE — Discharge Summary (Signed)
Patient ID: Ashley Owen MRN: [id number] DOB/AGE: 1975/07/29 46 y.o.  Admit date: 09/14/2021 Discharge date: 09/23/2021  Admission Diagnoses: IUP 13 weeks, Cholelithiasis, Hyperemesis and Ptyalism  Discharge Diagnoses: SAA, undeliveried  Prenatal Procedures: none  Consults: General surgery  Hospital Course:  This is a 46 y.o. [id number] with IUP at [redacted]w[redacted]d admitted for above Dx. She was started on IV fluids and antiemetic. Evaluated by surgery, who recommended conservative therapy. LFT's, amylase and lipase elevated. MRCP was negative. Sterids were added for hyperemesis and Robinul for ptyalism. Diet was slowly advance and pt was tolerating clear liquids and soft bland diet. Labs were decreasing.   She was deemed stable for discharge to home with outpatient follow up. Discharge medications, instructions and follow up reviewed with pt.   Discharge Exam: Temp:  [97.8 F (36.6 C)-98.8 F (37.1 C)] 98.2 F (36.8 C) (09/21 0801) Pulse Rate:  [77-91] 80 (09/21 0801) Resp:  [16-18] 18 (09/21 0801) BP: (93-117)/(44-69) 93/56 (09/21 0801) SpO2:  [98 %-100 %] 99 % (09/21 0801) Weight:  [88.5 kg] 88.5 kg (09/21 0411) Physical Examination: CONSTITUTIONAL: Well-developed, well-nourished female in no acute distress.  HENT:  Normocephalic, atraumatic, External right and left ear normal. Oropharynx is clear and moist EYES: Conjunctivae and EOM are normal. Pupils are equal, round, and reactive to light. No scleral icterus.  NECK: Normal range of motion, supple, no masses SKIN: Skin is warm and dry. No rash noted. Not diaphoretic. No erythema. No pallor. NEUROLGIC: Alert and oriented to person, place, and time. Normal reflexes, muscle tone coordination. No cranial nerve deficit noted. PSYCHIATRIC: Normal mood and affect. Normal behavior. Normal judgment and thought content. CARDIOVASCULAR: Normal heart rate noted, regular rhythm RESPIRATORY: Effort and breath sounds normal, no problems with respiration  noted MUSCULOSKELETAL: Normal range of motion. No edema and no tenderness. 2+ distal pulses. ABDOMEN: Soft, nontender, nondistended, gravid. CERVIX:  deferred  Fetal monitoring: + FHT's  Significant Diagnostic Studies:  Results for orders placed or performed during the hospital encounter of 09/14/21 (from the past 168 hour(s))  Comprehensive metabolic panel   Collection Time: 09/17/21  9:50 AM  Result Value Ref Range   Sodium 137 135 - 145 mmol/L   Potassium 4.9 3.5 - 5.1 mmol/L   Chloride 111 98 - 111 mmol/L   CO2 19 (L) 22 - 32 mmol/L   Glucose, Bld 106 (H) 70 - 99 mg/dL   BUN 5 (L) 6 - 20 mg/dL   Creatinine, Ser 3.72 0.44 - 1.00 mg/dL   Calcium 8.8 (L) 8.9 - 10.3 mg/dL   Total Protein 5.7 (L) 6.5 - 8.1 g/dL   Albumin 2.8 (L) 3.5 - 5.0 g/dL   AST 46 (H) 15 - 41 U/L   ALT 84 (H) 0 - 44 U/L   Alkaline Phosphatase 38 38 - 126 U/L   Total Bilirubin 2.4 (H) 0.3 - 1.2 mg/dL   GFR, Estimated >90 >21 mL/min   Anion gap 7 5 - 15  TSH   Collection Time: 09/19/21  9:01 AM  Result Value Ref Range   TSH 0.021 (L) 0.350 - 4.500 uIU/mL  Comprehensive metabolic panel   Collection Time: 09/19/21  9:01 AM  Result Value Ref Range   Sodium 135 135 - 145 mmol/L   Potassium 4.1 3.5 - 5.1 mmol/L   Chloride 105 98 - 111 mmol/L   CO2 21 (L) 22 - 32 mmol/L   Glucose, Bld 138 (H) 70 - 99 mg/dL   BUN 7 6 - 20 mg/dL  Creatinine, Ser 0.67 0.44 - 1.00 mg/dL   Calcium 9.1 8.9 - 72.5 mg/dL   Total Protein 6.1 (L) 6.5 - 8.1 g/dL   Albumin 2.9 (L) 3.5 - 5.0 g/dL   AST 366 (H) 15 - 41 U/L   ALT 219 (H) 0 - 44 U/L   Alkaline Phosphatase 38 38 - 126 U/L   Total Bilirubin 1.4 (H) 0.3 - 1.2 mg/dL   GFR, Estimated >44 >03 mL/min   Anion gap 9 5 - 15  Bilirubin, direct   Collection Time: 09/19/21  9:01 AM  Result Value Ref Range   Bilirubin, Direct 0.6 (H) 0.0 - 0.2 mg/dL  Amylase   Collection Time: 09/19/21  9:01 AM  Result Value Ref Range   Amylase 158 (H) 28 - 100 U/L  Lipase, blood    Collection Time: 09/19/21  9:01 AM  Result Value Ref Range   Lipase 79 (H) 11 - 51 U/L  T3 uptake   Collection Time: 09/19/21 10:39 AM  Result Value Ref Range   T3 Uptake Ratio 41 (H) 24 - 39 %  T4, free   Collection Time: 09/19/21 10:39 AM  Result Value Ref Range   Free T4 3.50 (H) 0.61 - 1.12 ng/dL  T4   Collection Time: 09/19/21 10:39 AM  Result Value Ref Range   T4, Total 24.3 (H) 4.5 - 12.0 ug/dL  Comprehensive metabolic panel   Collection Time: 09/21/21  6:07 AM  Result Value Ref Range   Sodium 135 135 - 145 mmol/L   Potassium 3.5 3.5 - 5.1 mmol/L   Chloride 106 98 - 111 mmol/L   CO2 20 (L) 22 - 32 mmol/L   Glucose, Bld 160 (H) 70 - 99 mg/dL   BUN 7 6 - 20 mg/dL   Creatinine, Ser 4.74 0.44 - 1.00 mg/dL   Calcium 8.6 (L) 8.9 - 10.3 mg/dL   Total Protein 5.4 (L) 6.5 - 8.1 g/dL   Albumin 2.6 (L) 3.5 - 5.0 g/dL   AST 66 (H) 15 - 41 U/L   ALT 245 (H) 0 - 44 U/L   Alkaline Phosphatase 32 (L) 38 - 126 U/L   Total Bilirubin 1.2 0.3 - 1.2 mg/dL   GFR, Estimated >25 >95 mL/min   Anion gap 9 5 - 15  Amylase   Collection Time: 09/21/21  6:07 AM  Result Value Ref Range   Amylase 152 (H) 28 - 100 U/L  Lipase, blood   Collection Time: 09/21/21  6:07 AM  Result Value Ref Range   Lipase 87 (H) 11 - 51 U/L  Hemoglobin A1c   Collection Time: 09/21/21  6:07 AM  Result Value Ref Range   Hgb A1c MFr Bld 4.6 (L) 4.8 - 5.6 %   Mean Plasma Glucose 85.32 mg/dL  Comprehensive metabolic panel   Collection Time: 09/23/21  6:47 AM  Result Value Ref Range   Sodium 135 135 - 145 mmol/L   Potassium 2.6 (LL) 3.5 - 5.1 mmol/L   Chloride 105 98 - 111 mmol/L   CO2 22 22 - 32 mmol/L   Glucose, Bld 113 (H) 70 - 99 mg/dL   BUN 7 6 - 20 mg/dL   Creatinine, Ser 6.38 0.44 - 1.00 mg/dL   Calcium 8.4 (L) 8.9 - 10.3 mg/dL   Total Protein 5.1 (L) 6.5 - 8.1 g/dL   Albumin 2.5 (L) 3.5 - 5.0 g/dL   AST 27 15 - 41 U/L   ALT 150 (H) 0 - 44 U/L  Alkaline Phosphatase 32 (L) 38 - 126 U/L   Total  Bilirubin 1.2 0.3 - 1.2 mg/dL   GFR, Estimated >60 >60 mL/min   Anion gap 8 5 - 15  CBC   Collection Time: 09/23/21  6:47 AM  Result Value Ref Range   WBC 6.0 4.0 - 10.5 K/uL   RBC 3.65 (L) 3.87 - 5.11 MIL/uL   Hemoglobin 10.2 (L) 12.0 - 15.0 g/dL   HCT 29.8 (L) 36.0 - 46.0 %   MCV 81.6 80.0 - 100.0 fL   MCH 27.9 26.0 - 34.0 pg   MCHC 34.2 30.0 - 36.0 g/dL   RDW 12.4 11.5 - 15.5 %   Platelets 184 150 - 400 K/uL   nRBC 0.0 0.0 - 0.2 %    Discharge Condition: Stable  Disposition: Discharge disposition: 01-Home or Self Care        Discharge Instructions     Discharge activity:  No Restrictions   Complete by: As directed    Discharge diet:  No restrictions   Complete by: As directed    No sexual activity restrictions   Complete by: As directed       Allergies as of 09/23/2021   No Known Allergies      Medication List     STOP taking these medications    ibuprofen 800 MG tablet Commonly known as: ADVIL   methocarbamol 500 MG tablet Commonly known as: ROBAXIN   naproxen 500 MG tablet Commonly known as: NAPROSYN   oxyCODONE-acetaminophen 5-325 MG tablet Commonly known as: Percocet       TAKE these medications    glycopyrrolate 1 MG tablet Commonly known as: ROBINUL Take 2 tablets (2 mg total) by mouth 3 (three) times daily.   methylPREDNISolone 4 MG tablet Commonly known as: MEDROL Take as directed Start taking on: October 04, 2021   ondansetron 8 MG disintegrating tablet Commonly known as: ZOFRAN-ODT Take 1 tablet (8 mg total) by mouth every 12 (twelve) hours.   pantoprazole 40 MG tablet Commonly known as: Protonix Take 1 tablet (40 mg total) by mouth daily.   potassium chloride SA 20 MEQ tablet Commonly known as: KLOR-CON M Take 1 tablet (20 mEq total) by mouth daily for 5 days.   promethazine 25 MG tablet Commonly known as: PHENERGAN Take 1 tablet (25 mg total) by mouth every 6 (six) hours as needed for nausea or vomiting.         Follow-up Information     Ralene Ok, MD. Call.   Specialty: General Surgery Why: Call to arrange follow up evaluation of gallbladder after delivery Contact information: [place] [address] [phone]         [facility] Follow up.   Why: Office will contact pt with appt date & time Contact information: [address] [place] [address] [phone]                Signed: Chancy Milroy M.D. 09/23/2021, 10:34 AM

## 2021-11-05 ENCOUNTER — Inpatient Hospital Stay (HOSPITAL_BASED_OUTPATIENT_CLINIC_OR_DEPARTMENT_OTHER)
Admission: EM | Admit: 2021-11-05 | Discharge: 2021-11-09 | DRG: 818 | Disposition: A | Discharge status: Home / Self Care | Payer: Medicaid Other | Attending: Family Medicine | Admitting: Family Medicine

## 2021-11-05 ENCOUNTER — Emergency Department (HOSPITAL_BASED_OUTPATIENT_CLINIC_OR_DEPARTMENT_OTHER): Payer: Medicaid Other

## 2021-11-05 ENCOUNTER — Other Ambulatory Visit: Payer: Self-pay

## 2021-11-05 ENCOUNTER — Encounter (HOSPITAL_COMMUNITY): Payer: Self-pay

## 2021-11-05 ENCOUNTER — Encounter (HOSPITAL_BASED_OUTPATIENT_CLINIC_OR_DEPARTMENT_OTHER): Payer: Self-pay | Admitting: Urology

## 2021-11-05 DIAGNOSIS — K429 Umbilical hernia without obstruction or gangrene: Secondary | ICD-10-CM | POA: Diagnosis present

## 2021-11-05 DIAGNOSIS — E876 Hypokalemia: Secondary | ICD-10-CM | POA: Diagnosis present

## 2021-11-05 DIAGNOSIS — K828 Other specified diseases of gallbladder: Secondary | ICD-10-CM | POA: Diagnosis present

## 2021-11-05 DIAGNOSIS — O368120 Decreased fetal movements, second trimester, not applicable or unspecified: Secondary | ICD-10-CM | POA: Diagnosis present

## 2021-11-05 DIAGNOSIS — D649 Anemia, unspecified: Secondary | ICD-10-CM | POA: Diagnosis present

## 2021-11-05 DIAGNOSIS — E0590 Thyrotoxicosis, unspecified without thyrotoxic crisis or storm: Secondary | ICD-10-CM | POA: Diagnosis present

## 2021-11-05 DIAGNOSIS — K8001 Calculus of gallbladder with acute cholecystitis with obstruction: Secondary | ICD-10-CM | POA: Diagnosis present

## 2021-11-05 DIAGNOSIS — Z1152 Encounter for screening for COVID-19: Secondary | ICD-10-CM

## 2021-11-05 DIAGNOSIS — Z3A20 20 weeks gestation of pregnancy: Secondary | ICD-10-CM

## 2021-11-05 DIAGNOSIS — K219 Gastro-esophageal reflux disease without esophagitis: Secondary | ICD-10-CM | POA: Diagnosis present

## 2021-11-05 DIAGNOSIS — R000 Tachycardia, unspecified: Secondary | ICD-10-CM | POA: Diagnosis present

## 2021-11-05 DIAGNOSIS — O26612 Liver and biliary tract disorders in pregnancy, second trimester: Principal | ICD-10-CM | POA: Diagnosis present

## 2021-11-05 DIAGNOSIS — Z79899 Other long term (current) drug therapy: Secondary | ICD-10-CM

## 2021-11-05 DIAGNOSIS — O99012 Anemia complicating pregnancy, second trimester: Secondary | ICD-10-CM | POA: Diagnosis present

## 2021-11-05 DIAGNOSIS — R1011 Right upper quadrant pain: Secondary | ICD-10-CM | POA: Diagnosis present

## 2021-11-05 DIAGNOSIS — O99282 Endocrine, nutritional and metabolic diseases complicating pregnancy, second trimester: Secondary | ICD-10-CM | POA: Diagnosis present

## 2021-11-05 DIAGNOSIS — R7989 Other specified abnormal findings of blood chemistry: Secondary | ICD-10-CM | POA: Diagnosis present

## 2021-11-05 DIAGNOSIS — R748 Abnormal levels of other serum enzymes: Secondary | ICD-10-CM | POA: Diagnosis present

## 2021-11-05 DIAGNOSIS — K810 Acute cholecystitis: Principal | ICD-10-CM | POA: Diagnosis present

## 2021-11-05 DIAGNOSIS — K819 Cholecystitis, unspecified: Principal | ICD-10-CM

## 2021-11-05 DIAGNOSIS — O99612 Diseases of the digestive system complicating pregnancy, second trimester: Secondary | ICD-10-CM | POA: Diagnosis present

## 2021-11-05 HISTORY — DX: Gastro-esophageal reflux disease without esophagitis: K21.9

## 2021-11-05 HISTORY — DX: Thyrotoxicosis, unspecified without thyrotoxic crisis or storm: E05.90

## 2021-11-05 LAB — LIPASE, BLOOD: Lipase: 24 U/L (ref 11–51)

## 2021-11-05 LAB — COMPREHENSIVE METABOLIC PANEL
ALT: 43 U/L (ref 0–44)
AST: 37 U/L (ref 15–41)
Albumin: 2.7 g/dL — ABNORMAL LOW (ref 3.5–5.0)
Alkaline Phosphatase: 80 U/L (ref 38–126)
Anion gap: 9 (ref 5–15)
BUN: 6 mg/dL (ref 6–20)
CO2: 23 mmol/L (ref 22–32)
Calcium: 8.8 mg/dL — ABNORMAL LOW (ref 8.9–10.3)
Chloride: 104 mmol/L (ref 98–111)
Creatinine, Ser: 0.62 mg/dL (ref 0.44–1.00)
GFR, Estimated: >60 mL/min (ref >60)
Glucose, Bld: 124 mg/dL — ABNORMAL HIGH (ref 70–99)
Potassium: 3.4 mmol/L — ABNORMAL LOW (ref 3.5–5.1)
Sodium: 136 mmol/L (ref 135–145)
Total Bilirubin: 1.2 mg/dL (ref 0.3–1.2)
Total Protein: 7.3 g/dL (ref 6.5–8.1)

## 2021-11-05 LAB — URINALYSIS, ROUTINE W REFLEX MICROSCOPIC
Glucose, UA: NEGATIVE mg/dL
Hgb urine dipstick: NEGATIVE
Ketones, ur: 15 mg/dL — AB
Leukocytes,Ua: NEGATIVE
Nitrite: NEGATIVE
Protein, ur: 100 mg/dL — AB
Specific Gravity, Urine: 1.015 (ref 1.005–1.030)
pH: >=9 (ref 5.0–8.0)

## 2021-11-05 LAB — CBG MONITORING, ED: Glucose-Capillary: 115 mg/dL — ABNORMAL HIGH (ref 70–99)

## 2021-11-05 LAB — CBC WITH DIFFERENTIAL/PLATELET
Abs Immature Granulocytes: 0.09 10*3/uL — ABNORMAL HIGH (ref 0.00–0.07)
Basophils Absolute: 0 10*3/uL (ref 0.0–0.1)
Basophils Relative: 0 %
Eosinophils Absolute: 0.1 10*3/uL (ref 0.0–0.5)
Eosinophils Relative: 1 %
HCT: 33.8 % — ABNORMAL LOW (ref 36.0–46.0)
Hemoglobin: 11 g/dL — ABNORMAL LOW (ref 12.0–15.0)
Immature Granulocytes: 1 %
Lymphocytes Relative: 11 %
Lymphs Abs: 1.4 10*3/uL (ref 0.7–4.0)
MCH: 25.9 pg — ABNORMAL LOW (ref 26.0–34.0)
MCHC: 32.5 g/dL (ref 30.0–36.0)
MCV: 79.7 fL — ABNORMAL LOW (ref 80.0–100.0)
Monocytes Absolute: 0.5 10*3/uL (ref 0.1–1.0)
Monocytes Relative: 4 %
Neutro Abs: 10.4 10*3/uL — ABNORMAL HIGH (ref 1.7–7.7)
Neutrophils Relative %: 83 %
Platelets: 374 10*3/uL (ref 150–400)
RBC: 4.24 MIL/uL (ref 3.87–5.11)
RDW: 12.6 % (ref 11.5–15.5)
WBC: 12.6 10*3/uL — ABNORMAL HIGH (ref 4.0–10.5)
nRBC: 0 % (ref 0.0–0.2)

## 2021-11-05 LAB — URINALYSIS, MICROSCOPIC (REFLEX): WBC, UA: NONE SEEN WBC/hpf (ref 0–5)

## 2021-11-05 LAB — RESP PANEL BY RT-PCR (FLU A&B, COVID) ARPGX2
Influenza A by PCR: NEGATIVE
Influenza B by PCR: NEGATIVE
SARS Coronavirus 2 by RT PCR: NEGATIVE

## 2021-11-05 MED ORDER — FENTANYL CITRATE PF 50 MCG/ML IJ SOSY
50.0000 ug | PREFILLED_SYRINGE | INTRAMUSCULAR | Status: DC | PRN
  Administered 2021-11-06 (×2): 50 ug via INTRAVENOUS
  Filled 2021-11-05 (×2): qty 1

## 2021-11-05 MED ORDER — NALOXONE HCL 0.4 MG/ML IJ SOLN: 0.4000 mg | INTRAMUSCULAR | Status: DC | PRN

## 2021-11-05 MED ORDER — ONDANSETRON HCL 4 MG/2ML IJ SOLN
4.0000 mg | Freq: Four times a day (QID) | INTRAMUSCULAR | Status: DC | PRN
  Filled 2021-11-05 (×3): qty 2

## 2021-11-05 MED ORDER — LACTATED RINGERS IV SOLN: INTRAVENOUS | Status: DC

## 2021-11-05 MED ORDER — SODIUM CHLORIDE 0.9 % IV SOLN
2.0000 g | Freq: Once | INTRAVENOUS | Status: AC
  Administered 2021-11-05: 2 g via INTRAVENOUS
  Filled 2021-11-05: qty 20

## 2021-11-05 MED ORDER — SODIUM CHLORIDE 0.9 % IV BOLUS
1000.0000 mL | Freq: Once | INTRAVENOUS | Status: AC
  Administered 2021-11-05: 1000 mL via INTRAVENOUS

## 2021-11-05 MED ORDER — ONDANSETRON HCL 4 MG/2ML IJ SOLN
4.0000 mg | Freq: Once | INTRAMUSCULAR | Status: AC
  Administered 2021-11-05: 4 mg via INTRAVENOUS
  Filled 2021-11-05: qty 2

## 2021-11-05 MED ORDER — ACETAMINOPHEN 325 MG PO TABS: 650.0000 mg | ORAL_TABLET | Freq: Four times a day (QID) | ORAL | Status: DC | PRN

## 2021-11-05 MED ORDER — ACETAMINOPHEN 650 MG RE SUPP: 650.0000 mg | Freq: Four times a day (QID) | RECTAL | Status: DC | PRN

## 2021-11-05 NOTE — Progress Notes (Signed)
Pt is G4P3 at [facility] who comes in complaining of severe upper abdominal pain that radiates into LUQ.  She also says that she has been vomiting blood q3-4 weeks and has not felt baby move.  She denies any vaginal bleeding or discharge.  Pt sees Dr Idolina Primer for [facility].  [place] has dopplered fht at 155-178bpm.  Dr Rip Harbour notified of pt status and he says pt may be cleared obstetrically.  He says for any further concerns they may have, they should contact Dr Trish Fountain office.

## 2021-11-05 NOTE — Progress Notes (Signed)
Pt new to unit.Marland Kitchenassessed. pt is requesting to drink, no orders at this time, will reah out to provider with updates on order, all other needs addressed at this time

## 2021-11-05 NOTE — ED Notes (Signed)
Attempted to call receiving unit to give report without success.

## 2021-11-05 NOTE — ED Provider Notes (Signed)
[facility] Provider Note   CSN: [id number] Arrival date & time: 11/05/21  1256     History {Add pertinent medical, surgical, social history, OB history to HPI:1} Chief Complaint  Patient presents with   Emesis During Pregnancy    Ashley Owen is a 46 y.o. female with past medical history significant for cholelithiasis, biliary colic who presents with vomiting, nausea with some hematemesis for the last 3 to 4 weeks intermittently, she reports she has had decreased fetal movement over the last 3 to 4 days, as well as some left lower quadrant pain.  She reports no complications with current pregnancy, no previous pregnancy complications although she is advanced age at this time.  She denies fever, chills, or vaginal bleeding at this time.  HPI     Home Medications Prior to Admission medications   Medication Sig Start Date End Date Taking? Authorizing Provider  glycopyrrolate (ROBINUL) 1 MG tablet Prendre 2 comprims (2 mg au total) par voie orale 3 (trois) fois par [employer]. (Take 2 tablets (2 mg total) by mouth 3 (three) times daily.) 09/23/21   Chancy Milroy, MD  methylPREDNISolone (MEDROL) 4 MG tablet Prendre comme indiqu. (Take as directed) 10/04/21   Chancy Milroy, MD  ondansetron (ZOFRAN-ODT) 8 MG disintegrating tablet Prendre 1 comprim (8 mg au total) par voie orale toutes les 12 (douze) heures. (Take 1 tablet (8 mg total) by mouth every 12 (twelve) hours.) 09/23/21   Chancy Milroy, MD  pantoprazole (PROTONIX) 40 MG tablet Prendre 1 comprim (40 mg au total) par voie orale par jour. (Take 1 tablet (40 mg total) by mouth daily.) 09/23/21   Chancy Milroy, MD  potassium chloride SA (KLOR-CON M) 20 MEQ tablet Prendre 1 comprim (20 mEq au total) par voie orale par jour pendant 5 jours. (Take 1 tablet (20 mEq total) by mouth daily for 5 days.) 09/23/21 09/28/21  Chancy Milroy, MD  promethazine (PHENERGAN) 25 MG tablet Prendre 1 comprim (25 mg au  total) par voie orale toutes les 6 (six) heures, au besoin, en cas de nauses ou de vomissements. (Take 1 tablet (25 mg total) by mouth every 6 (six) hours as needed for nausea or vomiting.) 09/23/21   Chancy Milroy, MD      Allergies    Patient has no known allergies.    Review of Systems   Review of Systems  Gastrointestinal:  Positive for abdominal pain, nausea and vomiting.  All other systems reviewed and are negative.   Physical Exam Updated Vital Signs BP 101/72 (BP Location: Right Arm)   Pulse (!) 105   Temp 98.3 F (36.8 C) (Oral)   Resp 15   Ht 5\' 5"  (1.651 m)   Wt 88.5 kg   LMP  (LMP Unknown)   SpO2 100%   BMI 32.47 kg/m  Physical Exam Vitals and nursing note reviewed.  Constitutional:      General: She is not in acute distress.    Appearance: Normal appearance.  HENT:     Head: Normocephalic and atraumatic.  Eyes:     General:        Right eye: No discharge.        Left eye: No discharge.  Cardiovascular:     Rate and Rhythm: Normal rate and regular rhythm.     Heart sounds: No murmur heard.    No friction rub. No gallop.  Pulmonary:     Effort: Pulmonary effort is normal.  Breath sounds: Normal breath sounds.  Abdominal:     General: Bowel sounds are normal.     Palpations: Abdomen is soft.  Skin:    General: Skin is warm and dry.     Capillary Refill: Capillary refill takes less than 2 seconds.  Neurological:     Mental Status: She is alert and oriented to person, place, and time.  Psychiatric:        Mood and Affect: Mood normal.        Behavior: Behavior normal.     ED Results / Procedures / Treatments   Labs (all labs ordered are listed, but only abnormal results are displayed) Labs Reviewed  CBC WITH DIFFERENTIAL/PLATELET - Abnormal; Notable for the following components:      Result Value   WBC 12.6 (*)    Hemoglobin 11.0 (*)    HCT 33.8 (*)    MCV 79.7 (*)    MCH 25.9 (*)    Neutro Abs 10.4 (*)    Abs Immature Granulocytes  0.09 (*)    All other components within normal limits  RESP PANEL BY RT-PCR (FLU A&B, COVID) ARPGX2  COMPREHENSIVE METABOLIC PANEL  LIPASE, BLOOD  CBG MONITORING, ED    EKG None  Radiology No results found.  Procedures Procedures  {Document cardiac monitor, telemetry assessment procedure when appropriate:1}  Medications Ordered in ED Medications  sodium chloride 0.9 % bolus 1,000 mL (has no administration in time range)  ondansetron (ZOFRAN) injection 4 mg (has no administration in time range)    ED Course/ Medical Decision Making/ A&P                           Medical Decision Making Amount and/or Complexity of Data Reviewed Labs: ordered. Radiology: ordered.  Risk Prescription drug management.   ***  {Document critical care time when appropriate:1} {Document review of labs and clinical decision tools ie heart score, Chads2Vasc2 etc:1}  {Document your independent review of radiology images, and any outside records:1} {Document your discussion with family members, caretakers, and with consultants:1} {Document social determinants of health affecting pt's care:1} {Document your decision making why or why not admission, treatments were needed:1} Final Clinical Impression(s) / ED Diagnoses Final diagnoses:  None    Rx / DC Orders ED Discharge Orders     None

## 2021-11-05 NOTE — ED Notes (Signed)
[facility] called back, pt cleared obstetrically.h/o gallstones per OBGYN

## 2021-11-05 NOTE — ED Triage Notes (Signed)
Pt states nausea and vomiting  States vomiting blood x 3-4 weeks  States hasn't felt baby move in 3 days  Denies vaginal bleeding or abd cramping  Pt states EDD 03/31/2022  OBGYn Dr Idolina Primer  G-4. P-3, A-0

## 2021-11-05 NOTE — ED Notes (Signed)
Re-paged for general surgery per provider at 4:08.

## 2021-11-05 NOTE — ED Notes (Signed)
FHT 165-178, [facility] called and notified, Pt having severe upper abdominal pain radiating into LUQ and around to back

## 2021-11-05 NOTE — Progress Notes (Signed)
Patient placement was called. Admission nurse is aware of patient's arrival.

## 2021-11-05 NOTE — ED Notes (Signed)
Report given to [facility], approximately 20 min ETA

## 2021-11-06 ENCOUNTER — Other Ambulatory Visit: Payer: Self-pay

## 2021-11-06 ENCOUNTER — Encounter (HOSPITAL_COMMUNITY): Payer: Self-pay | Admitting: Internal Medicine

## 2021-11-06 ENCOUNTER — Encounter (HOSPITAL_COMMUNITY)
Admission: EM | Disposition: A | Discharge status: Home / Self Care | Payer: Self-pay | Source: Home / Self Care | Attending: Family Medicine

## 2021-11-06 ENCOUNTER — Inpatient Hospital Stay (HOSPITAL_COMMUNITY): Payer: Medicaid Other | Admitting: Certified Registered Nurse Anesthetist

## 2021-11-06 DIAGNOSIS — R1011 Right upper quadrant pain: Secondary | ICD-10-CM | POA: Diagnosis present

## 2021-11-06 DIAGNOSIS — E876 Hypokalemia: Secondary | ICD-10-CM | POA: Diagnosis present

## 2021-11-06 DIAGNOSIS — D649 Anemia, unspecified: Secondary | ICD-10-CM | POA: Diagnosis present

## 2021-11-06 HISTORY — PX: CHOLECYSTECTOMY: SHX55

## 2021-11-06 LAB — CBC WITH DIFFERENTIAL/PLATELET
Abs Immature Granulocytes: 0.09 10*3/uL — ABNORMAL HIGH (ref 0.00–0.07)
Basophils Absolute: 0 10*3/uL (ref 0.0–0.1)
Basophils Relative: 0 %
Eosinophils Absolute: 0.2 10*3/uL (ref 0.0–0.5)
Eosinophils Relative: 2 %
HCT: 28 % — ABNORMAL LOW (ref 36.0–46.0)
Hemoglobin: 9 g/dL — ABNORMAL LOW (ref 12.0–15.0)
Immature Granulocytes: 1 %
Lymphocytes Relative: 19 %
Lymphs Abs: 1.8 10*3/uL (ref 0.7–4.0)
MCH: 25.9 pg — ABNORMAL LOW (ref 26.0–34.0)
MCHC: 32.1 g/dL (ref 30.0–36.0)
MCV: 80.5 fL (ref 80.0–100.0)
Monocytes Absolute: 0.5 10*3/uL (ref 0.1–1.0)
Monocytes Relative: 6 %
Neutro Abs: 6.7 10*3/uL (ref 1.7–7.7)
Neutrophils Relative %: 72 %
Platelets: 280 10*3/uL (ref 150–400)
RBC: 3.48 MIL/uL — ABNORMAL LOW (ref 3.87–5.11)
RDW: 12.6 % (ref 11.5–15.5)
WBC: 9.3 10*3/uL (ref 4.0–10.5)
nRBC: 0 % (ref 0.0–0.2)

## 2021-11-06 LAB — COMPREHENSIVE METABOLIC PANEL
ALT: 35 U/L (ref 0–44)
AST: 26 U/L (ref 15–41)
Albumin: 2 g/dL — ABNORMAL LOW (ref 3.5–5.0)
Alkaline Phosphatase: 62 U/L (ref 38–126)
Anion gap: 5 (ref 5–15)
BUN: <5 mg/dL — ABNORMAL LOW (ref 6–20)
CO2: 21 mmol/L — ABNORMAL LOW (ref 22–32)
Calcium: 8.4 mg/dL — ABNORMAL LOW (ref 8.9–10.3)
Chloride: 107 mmol/L (ref 98–111)
Creatinine, Ser: 0.57 mg/dL (ref 0.44–1.00)
GFR, Estimated: >60 mL/min (ref >60)
Glucose, Bld: 89 mg/dL (ref 70–99)
Potassium: 3.2 mmol/L — ABNORMAL LOW (ref 3.5–5.1)
Sodium: 133 mmol/L — ABNORMAL LOW (ref 135–145)
Total Bilirubin: 1.7 mg/dL — ABNORMAL HIGH (ref 0.3–1.2)
Total Protein: 5.3 g/dL — ABNORMAL LOW (ref 6.5–8.1)

## 2021-11-06 LAB — MAGNESIUM: Magnesium: 1.7 mg/dL (ref 1.7–2.4)

## 2021-11-06 LAB — PROTIME-INR
INR: 1.2 (ref 0.8–1.2)
Prothrombin Time: 14.8 seconds (ref 11.4–15.2)

## 2021-11-06 SURGERY — LAPAROSCOPIC CHOLECYSTECTOMY
Anesthesia: General | Site: Abdomen

## 2021-11-06 MED ORDER — POTASSIUM CHLORIDE CRYS ER 20 MEQ PO TBCR
20.0000 meq | EXTENDED_RELEASE_TABLET | Freq: Once | ORAL | Status: AC
  Administered 2021-11-06: 20 meq via ORAL
  Filled 2021-11-06 (×2): qty 1

## 2021-11-06 MED ORDER — PROCHLORPERAZINE EDISYLATE 10 MG/2ML IJ SOLN: 10.0000 mg | INTRAMUSCULAR | Status: DC | PRN

## 2021-11-06 MED ORDER — METHOCARBAMOL 1000 MG/10ML IJ SOLN
500.0000 mg | Freq: Four times a day (QID) | INTRAVENOUS | Status: DC | PRN
  Administered 2021-11-06 – 2021-11-07 (×2): 500 mg via INTRAVENOUS
  Filled 2021-11-06 (×2): qty 5

## 2021-11-06 MED ORDER — PHENYLEPHRINE 80 MCG/ML (10ML) SYRINGE FOR IV PUSH (FOR BLOOD PRESSURE SUPPORT)
PREFILLED_SYRINGE | INTRAVENOUS | Status: DC | PRN
  Administered 2021-11-06: 80 ug via INTRAVENOUS
  Administered 2021-11-06: 240 ug via INTRAVENOUS

## 2021-11-06 MED ORDER — LIDOCAINE 2% (20 MG/ML) 5 ML SYRINGE
INTRAMUSCULAR | Status: DC | PRN
  Administered 2021-11-06: 60 mg via INTRAVENOUS

## 2021-11-06 MED ORDER — BUPIVACAINE-EPINEPHRINE (PF) 0.25% -1:200000 IJ SOLN
INTRAMUSCULAR | Status: AC
  Filled 2021-11-06: qty 30

## 2021-11-06 MED ORDER — ONDANSETRON HCL 4 MG/2ML IJ SOLN
4.0000 mg | Freq: Four times a day (QID) | INTRAMUSCULAR | Status: DC | PRN
  Administered 2021-11-06 – 2021-11-07 (×3): 4 mg via INTRAVENOUS
  Filled 2021-11-06: qty 2

## 2021-11-06 MED ORDER — CHLORHEXIDINE GLUCONATE 0.12 % MT SOLN: 15.0000 mL | Freq: Once | OROMUCOSAL | Status: AC

## 2021-11-06 MED ORDER — CEFAZOLIN SODIUM-DEXTROSE 2-4 GM/100ML-% IV SOLN
INTRAVENOUS | Status: AC
  Filled 2021-11-06: qty 100

## 2021-11-06 MED ORDER — CEFAZOLIN SODIUM-DEXTROSE 2-4 GM/100ML-% IV SOLN
2.0000 g | Freq: Once | INTRAVENOUS | Status: AC
  Administered 2021-11-06: 2 g via INTRAVENOUS

## 2021-11-06 MED ORDER — FENTANYL CITRATE (PF) 100 MCG/2ML IJ SOLN
50.0000 ug | Freq: Once | INTRAMUSCULAR | Status: AC
  Administered 2021-11-06: 50 ug via INTRAVENOUS

## 2021-11-06 MED ORDER — FENTANYL CITRATE (PF) 100 MCG/2ML IJ SOLN
INTRAMUSCULAR | Status: AC
  Filled 2021-11-06: qty 2

## 2021-11-06 MED ORDER — PROPOFOL 500 MG/50ML IV EMUL
INTRAVENOUS | Status: DC | PRN
  Administered 2021-11-06: 150 ug/kg/min via INTRAVENOUS

## 2021-11-06 MED ORDER — OXYCODONE HCL 5 MG PO TABS
10.0000 mg | ORAL_TABLET | ORAL | Status: DC | PRN
  Administered 2021-11-06 – 2021-11-07 (×3): 10 mg via ORAL
  Filled 2021-11-06 (×3): qty 2

## 2021-11-06 MED ORDER — HYDROMORPHONE HCL 1 MG/ML IJ SOLN
1.0000 mg | Freq: Once | INTRAMUSCULAR | Status: AC
  Administered 2021-11-06: 1 mg via INTRAVENOUS
  Filled 2021-11-06: qty 1

## 2021-11-06 MED ORDER — BUPIVACAINE HCL 0.25 % IJ SOLN
INTRAMUSCULAR | Status: DC | PRN
  Administered 2021-11-06: 30 mL

## 2021-11-06 MED ORDER — FENTANYL CITRATE (PF) 250 MCG/5ML IJ SOLN
INTRAMUSCULAR | Status: DC | PRN
  Administered 2021-11-06 (×5): 50 ug via INTRAVENOUS

## 2021-11-06 MED ORDER — OXYCODONE HCL 5 MG PO TABS: 5.0000 mg | ORAL_TABLET | Freq: Once | ORAL | Status: DC | PRN

## 2021-11-06 MED ORDER — ONDANSETRON HCL 4 MG/2ML IJ SOLN
INTRAMUSCULAR | Status: DC | PRN
  Administered 2021-11-06: 4 mg via INTRAVENOUS

## 2021-11-06 MED ORDER — ACETAMINOPHEN 325 MG PO TABS: 325.0000 mg | ORAL_TABLET | ORAL | Status: DC | PRN

## 2021-11-06 MED ORDER — FENTANYL CITRATE (PF) 100 MCG/2ML IJ SOLN
25.0000 ug | INTRAMUSCULAR | Status: DC | PRN
  Administered 2021-11-06 (×3): 50 ug via INTRAVENOUS

## 2021-11-06 MED ORDER — FENTANYL CITRATE (PF) 250 MCG/5ML IJ SOLN
INTRAMUSCULAR | Status: AC
  Filled 2021-11-06: qty 5

## 2021-11-06 MED ORDER — SIMETHICONE 80 MG PO CHEW
80.0000 mg | CHEWABLE_TABLET | Freq: Four times a day (QID) | ORAL | Status: DC | PRN
  Administered 2021-11-07: 80 mg via ORAL
  Filled 2021-11-06 (×2): qty 1

## 2021-11-06 MED ORDER — DEXAMETHASONE SODIUM PHOSPHATE 10 MG/ML IJ SOLN
INTRAMUSCULAR | Status: DC | PRN
  Administered 2021-11-06: 5 mg via INTRAVENOUS

## 2021-11-06 MED ORDER — ACETAMINOPHEN 10 MG/ML IV SOLN
1000.0000 mg | Freq: Once | INTRAVENOUS | Status: DC | PRN
  Administered 2021-11-06: 1000 mg via INTRAVENOUS

## 2021-11-06 MED ORDER — PROPOFOL 10 MG/ML IV BOLUS
INTRAVENOUS | Status: DC | PRN
  Administered 2021-11-06: 200 mg via INTRAVENOUS

## 2021-11-06 MED ORDER — SODIUM CHLORIDE 0.9 % IR SOLN
Status: DC | PRN
  Administered 2021-11-06: 1000 mL

## 2021-11-06 MED ORDER — OXYCODONE HCL 5 MG PO TABS
5.0000 mg | ORAL_TABLET | ORAL | Status: DC | PRN
  Administered 2021-11-07 – 2021-11-08 (×3): 5 mg via ORAL
  Filled 2021-11-06 (×3): qty 1

## 2021-11-06 MED ORDER — ORAL CARE MOUTH RINSE: 15.0000 mL | Freq: Once | OROMUCOSAL | Status: AC

## 2021-11-06 MED ORDER — MIDAZOLAM HCL 2 MG/2ML IJ SOLN
INTRAMUSCULAR | Status: AC
  Filled 2021-11-06: qty 2

## 2021-11-06 MED ORDER — 0.9 % SODIUM CHLORIDE (POUR BTL) OPTIME
TOPICAL | Status: DC | PRN
  Administered 2021-11-06: 1000 mL

## 2021-11-06 MED ORDER — SUGAMMADEX SODIUM 200 MG/2ML IV SOLN
INTRAVENOUS | Status: DC | PRN
  Administered 2021-11-06: 175 mg via INTRAVENOUS

## 2021-11-06 MED ORDER — CHLORHEXIDINE GLUCONATE 0.12 % MT SOLN
OROMUCOSAL | Status: AC
  Administered 2021-11-06: 15 mL via OROMUCOSAL
  Filled 2021-11-06: qty 15

## 2021-11-06 MED ORDER — OXYCODONE HCL 5 MG/5ML PO SOLN: 5.0000 mg | Freq: Once | ORAL | Status: DC | PRN

## 2021-11-06 MED ORDER — ROCURONIUM BROMIDE 10 MG/ML (PF) SYRINGE
PREFILLED_SYRINGE | INTRAVENOUS | Status: DC | PRN
  Administered 2021-11-06: 60 mg via INTRAVENOUS

## 2021-11-06 MED ORDER — PROPOFOL 10 MG/ML IV BOLUS
INTRAVENOUS | Status: AC
  Filled 2021-11-06: qty 20

## 2021-11-06 MED ORDER — DOCUSATE SODIUM 100 MG PO CAPS
100.0000 mg | ORAL_CAPSULE | Freq: Two times a day (BID) | ORAL | Status: DC
  Administered 2021-11-06 – 2021-11-09 (×4): 100 mg via ORAL
  Filled 2021-11-06 (×5): qty 1

## 2021-11-06 MED ORDER — ACETAMINOPHEN 160 MG/5ML PO SOLN: 325.0000 mg | ORAL | Status: DC | PRN

## 2021-11-06 MED ORDER — HYDROMORPHONE HCL 1 MG/ML IJ SOLN
0.5000 mg | INTRAMUSCULAR | Status: DC | PRN
  Administered 2021-11-06 – 2021-11-07 (×5): 0.5 mg via INTRAVENOUS
  Filled 2021-11-06 (×6): qty 0.5

## 2021-11-06 MED ORDER — ACETAMINOPHEN 325 MG PO TABS
650.0000 mg | ORAL_TABLET | Freq: Four times a day (QID) | ORAL | Status: DC
  Administered 2021-11-06 – 2021-11-09 (×7): 650 mg via ORAL
  Filled 2021-11-06 (×7): qty 2

## 2021-11-06 MED ORDER — ACETAMINOPHEN 10 MG/ML IV SOLN
INTRAVENOUS | Status: AC
  Filled 2021-11-06: qty 100

## 2021-11-06 MED ORDER — LACTATED RINGERS IV SOLN: INTRAVENOUS | Status: DC

## 2021-11-06 SURGICAL SUPPLY — 42 items
APPLIER CLIP 5 13 M/L LIGAMAX5 (MISCELLANEOUS) ×1
BAG COUNTER SPONGE SURGICOUNT (BAG) ×1 IMPLANT
CANISTER SUCT 3000ML PPV (MISCELLANEOUS) ×1 IMPLANT
CHLORAPREP W/TINT 26 (MISCELLANEOUS) ×1 IMPLANT
CLIP APPLIE 5 13 M/L LIGAMAX5 (MISCELLANEOUS) ×1 IMPLANT
COVER SURGICAL LIGHT HANDLE (MISCELLANEOUS) ×1 IMPLANT
DERMABOND ADVANCED .7 DNX12 (GAUZE/BANDAGES/DRESSINGS) ×1 IMPLANT
DERMABOND ADVANCED .7 DNX6 (GAUZE/BANDAGES/DRESSINGS) IMPLANT
ELECT REM PT RETURN 9FT ADLT (ELECTROSURGICAL) ×1
ELECTRODE REM PT RTRN 9FT ADLT (ELECTROSURGICAL) ×1 IMPLANT
ENDOLOOP SUT PDS II  0 18 (SUTURE) ×1
ENDOLOOP SUT PDS II 0 18 (SUTURE) IMPLANT
GLOVE BIO SURGEON STRL SZ7.5 (GLOVE) ×1 IMPLANT
GLOVE BIOGEL PI IND STRL 8 (GLOVE) ×1 IMPLANT
GOWN STRL REUS W/ TWL LRG LVL3 (GOWN DISPOSABLE) ×2 IMPLANT
GOWN STRL REUS W/ TWL XL LVL3 (GOWN DISPOSABLE) ×1 IMPLANT
GOWN STRL REUS W/TWL LRG LVL3 (GOWN DISPOSABLE) ×2
GOWN STRL REUS W/TWL XL LVL3 (GOWN DISPOSABLE) ×1
GRASPER SUT TROCAR 14GX15 (MISCELLANEOUS) ×1 IMPLANT
KIT BASIN OR (CUSTOM PROCEDURE TRAY) ×1 IMPLANT
KIT TURNOVER KIT B (KITS) ×1 IMPLANT
NDL 22X1.5 STRL (OR ONLY) (MISCELLANEOUS) ×1 IMPLANT
NDL INSUFFLATION 14GA 120MM (NEEDLE) ×1 IMPLANT
NEEDLE 22X1.5 STRL (OR ONLY) (MISCELLANEOUS) ×1 IMPLANT
NEEDLE INSUFFLATION 14GA 120MM (NEEDLE) ×1 IMPLANT
NS IRRIG 1000ML POUR BTL (IV SOLUTION) ×1 IMPLANT
PAD ARMBOARD 7.5X6 YLW CONV (MISCELLANEOUS) ×1 IMPLANT
POUCH RETRIEVAL ECOSAC 10 (ENDOMECHANICALS) ×1 IMPLANT
POUCH RETRIEVAL ECOSAC 10MM (ENDOMECHANICALS) ×1
SCISSORS LAP 5X35 DISP (ENDOMECHANICALS) ×1 IMPLANT
SET IRRIG TUBING LAPAROSCOPIC (IRRIGATION / IRRIGATOR) ×1 IMPLANT
SET TUBE SMOKE EVAC HIGH FLOW (TUBING) ×1 IMPLANT
SLEEVE ENDOPATH XCEL 5M (ENDOMECHANICALS) ×2 IMPLANT
SPECIMEN JAR SMALL (MISCELLANEOUS) ×1 IMPLANT
SUT MNCRL AB 4-0 PS2 18 (SUTURE) ×1 IMPLANT
TOWEL GREEN STERILE (TOWEL DISPOSABLE) ×1 IMPLANT
TOWEL GREEN STERILE FF (TOWEL DISPOSABLE) ×1 IMPLANT
TRAY LAPAROSCOPIC MC (CUSTOM PROCEDURE TRAY) ×1 IMPLANT
TROCAR XCEL NON-BLD 11X100MML (ENDOMECHANICALS) ×1 IMPLANT
TROCAR XCEL NON-BLD 5MMX100MML (ENDOMECHANICALS) ×1 IMPLANT
WARMER LAPAROSCOPE (MISCELLANEOUS) ×1 IMPLANT
WATER STERILE IRR 1000ML POUR (IV SOLUTION) ×1 IMPLANT

## 2021-11-06 NOTE — Progress Notes (Signed)
Consultation Progress Note   Patient: Ashley Owen DOB: [birth year] DOA: 11/05/2021 DOS: the patient was seen and examined on 11/06/2021 Primary service: DibiaManfred Shirts, MD  Brief hospital course:  46 y.o. G4P9female [redacted] weeks pregnant with medical history significant for chronic anemia associated baseline hemoglobin 10-12 who is admitted to [facility] on 11/05/2021 by way of transfer from [place] for further evaluation and management of presenting right upper quadrant abdominal discomfort .    Assessment and Plan: Principal Problem: Acute cholecystectomy Active Problems:   Hypokalemia   Chronic anemia  Acute cholecystitis-s/p laparoscopic cholecystectomy, Cont pain mgt, IVF, IV antibiotics   Hypokalemia: Repleted orally   Active Pregnancy: G4P3 female currently [redacted] weeks pregnant.  Ashley discussed patient's case and imaging with the on-call OB/GYN, Dr. Rip Harbour,    Chronic normocytic anemia: Documented history of such, with chart review revealing baseline hemoglobin range of 10-12      [facility] will continue to follow the patient.  Subjective: s/p laparoscopic cholecystectomy   Physical Exam: General: appears to be stated age; alert, oriented Skin: warm, dry, no rash Head:  AT/[place] Mouth:  Oral mucosa membranes appear dry, normal dentition Neck: supple; trachea midline Heart:  RRR; did not appreciate any M/R/G Lungs: CTAB, did not appreciate any wheezes, rales, or rhonchi Abdomen: + BS; soft, ND, mild tenderness No guarding, rigidity, or rebound tenderness Vascular: 2+ pedal pulses b/l; 2+ radial pulses b/l Extremities: no peripheral edema, no muscle wasting Neuro: strength and sensation intact in upper and lower extremities b/l Vitals:   11/06/21 1025 11/06/21 1235 11/06/21 1245 11/06/21 1300  BP:  125/73 127/75 122/72  Pulse:  (!) 109 (!) 110 (!) 103  Resp:  18 (!) 22 18  Temp:  98.2 F (36.8 C)    TempSrc:      SpO2:   100% 100% 100%  Weight: 88.5 kg     Height: 5\' 5"  (1.651 m)      Data Reviewed:  There are no new results to review at this time.  Family Communication:   Time spent: 15 minutes.  Author: Cristela Felt, MD 11/06/2021 1:11 PM  For on call review [email or web address].

## 2021-11-06 NOTE — Progress Notes (Signed)
OB Rapid response RN at bedside.

## 2021-11-06 NOTE — Progress Notes (Signed)
Rapid OB RN called for pre/post Fetal heart tone monitoring before surgery.  Dr Smith Robert with anesthesia aware.

## 2021-11-06 NOTE — H&P (Addendum)
History and Physical    PLEASE NOTE THAT DRAGON DICTATION SOFTWARE WAS USED IN THE CONSTRUCTION OF THIS NOTE.   Ashley Owen QIW:[id number] DOB: Nov 03, 1975 DOA: 11/05/2021  PCP: [facility]  Patient coming from: home   I have personally briefly reviewed patient's old medical records in [facility]  Chief Complaint: Right upper quadrant abdominal discomfort  HPI: Ashley Owen is a 46 y.o. G4P19female [redacted] weeks pregnant with medical history significant for chronic anemia associated baseline hemoglobin 10-12 who is admitted to [facility] on 11/05/2021 by way of transfer from [facility] for further evaluation and management of presenting right upper quadrant abdominal discomfort .   The patient reports approximately 1 week of progressive sharp nonradiating right upper quadrant abdominal discomfort, worse with palpation as well as worse shortly after attempting to eat or drink anything over that timeframe.  Associated with nausea/vomiting, nonbilious, with occasional pink-tinged vomitus in the absence of coffee-ground appearance.  Not on any blood thinners.  Denies any associated subjective fever, chills, rigors, or generalized myalgias.  No associated any diarrhea, melena, hematochezia.  No recent rash, including no jaundice.  No recent chest pain, shortness of breath, cough, dysuria, gross hematuria.  No report of recent vaginal bleeding.  She has diminished oral intake as result of the aforementioned recent right upper quadrant abdominal discomfort associate with nausea/vomiting.  Reportedly follows with OB/GYN named Dr. Shea Evans.   [facility] ED Course:  Vital signs in the ED were notable for the following: Afebrile; heart rate 95-1 5; systolic blood pressures in the 90s to 110s; respiratory rate 15-24, oxygen saturation 97 to 100% on room air.  Labs were notable for the following: CMP notable for potassium 3.4, BUN 6, creatinine 0.62, calcium,  adjusted for mild hypoalbuminemia noted to be 9.8, avidin 2.7, otherwise liver enzymes within normal limits.  Lipase 24.  CBC notable for white cell count 12,600, globin 11 compared to 10.2 on 09/23/2021, platelet count 374.  Urinalysis notable for no white blood cells, leukocyte Estrace/nitrate negative.  COVID-19/Avinza PCR negative.  Imaging and additional notable ED work-up: EKG showed sinus tachycardia with heart rate 100, normal intervals, no evidence of T wave or ST changes, including no evidence of ST elevation.  Further quadrant ultrasound showed cholelithiasis and sludge and a distended gallbladder, but in the absence of any evidence of gallbladder wall thickening or pericholecystic fluid.  No evidence of choledocholithiasis, and common bile duct was nondilated, measured to be 4 mm.  Positive sonographic Murphy sign noted.  Overall, these findings were felt to be equivocal for acute cholecystitis.  EDP discussed the patient's case and imaging with the on-call OB/GYN, Dr. Alysia Penna, Who formally evaluated the patient in person today, noting fetal heart rate in the 150s to 170s, and without any evidence concerning decels. Dr. Alysia Penna reportedly cleared the patient for medical admission for further evaluation management of presenting right upper quadrant abdominal discomfort.  Additionally, EDP reportedly also discussed patient's case and imaging with the on-call general surgeon, who formally consult, with additional recommendations pending at this time.   While in the ED, the following were administered: Rocephin, and normal saline x1 L bolus, Zofran 4 mg IV x1.  Subsequently, the patient was admitted to [facility] for further evaluation management of presenting right upper quadrant pain, including further discernment between biliary colic versus acute cholecystitis    Review of Systems: As per HPI otherwise 10 point review of systems negative.  Past Medical History:  Diagnosis Date   GERD  (gastroesophageal reflux disease)    Hyperthyroidism     History reviewed. No pertinent surgical history.  Social History:  reports that she has never smoked. She has never used smokeless tobacco. She reports that she does not drink alcohol and does not use drugs.   No Known Allergies  History reviewed. No pertinent family history.   Prior to Admission medications   Medication Sig Start Date End Date Taking? Authorizing Provider  glycopyrrolate (ROBINUL) 1 MG tablet Prendre 2 comprims (2 mg au total) par voie orale 3 (trois) fois par [employer]. (Take 2 tablets (2 mg total) by mouth 3 (three) times daily.) 09/23/21   Hermina Staggers, MD  methylPREDNISolone (MEDROL) 4 MG tablet Prendre comme indiqu. (Take as directed) 10/04/21   Hermina Staggers, MD  ondansetron (ZOFRAN-ODT) 8 MG disintegrating tablet Prendre 1 comprim (8 mg au total) par voie orale toutes les 12 (douze) heures. (Take 1 tablet (8 mg total) by mouth every 12 (twelve) hours.) 09/23/21   Hermina Staggers, MD  pantoprazole (PROTONIX) 40 MG tablet Prendre 1 comprim (40 mg au total) par voie orale par jour. (Take 1 tablet (40 mg total) by mouth daily.) 09/23/21   Hermina Staggers, MD  potassium chloride SA (KLOR-CON M) 20 MEQ tablet Prendre 1 comprim (20 mEq au total) par voie orale par jour pendant 5 jours. (Take 1 tablet (20 mEq total) by mouth daily for 5 days.) 09/23/21 09/28/21  Hermina Staggers, MD  promethazine (PHENERGAN) 25 MG tablet Prendre 1 comprim (25 mg au total) par voie orale toutes les 6 (six) heures, au besoin, en cas de nauses ou de vomissements. (Take 1 tablet (25 mg total) by mouth every 6 (six) hours as needed for nausea or vomiting.) 09/23/21   Hermina Staggers, MD     Objective    Physical Exam: Vitals:   11/05/21 1800 11/05/21 1939 11/05/21 2354 11/06/21 0523  BP: 107/80 92/66 (!) 91/51 (!) 90/56  Pulse: 98 93 89 83  Resp:  17 17 17   Temp:  98.1 F (36.7 C) 98.6 F (37 C) 98.4 F (36.9 C)  TempSrc:   Oral    SpO2: 100% 100% 100% 99%  Weight:      Height:        General: appears to be stated age; alert, oriented Skin: warm, dry, no rash Head:  AT/[place] Mouth:  Oral mucosa membranes appear dry, normal dentition Neck: supple; trachea midline Heart:  RRR; did not appreciate any M/R/G Lungs: CTAB, did not appreciate any wheezes, rales, or rhonchi Abdomen: + BS; soft, ND, mild tenderness to palpation over ruq in the absence of associated guarding, rigidity, or rebound tenderness Vascular: 2+ pedal pulses b/l; 2+ radial pulses b/l Extremities: no peripheral edema, no muscle wasting Neuro: strength and sensation intact in upper and lower extremities b/l   Labs on Admission: I have personally reviewed following labs and imaging studies  CBC: Recent Labs  Lab 11/05/21 1330 11/06/21 0401  WBC 12.6* 9.3  NEUTROABS 10.4* 6.7  HGB 11.0* 9.0*  HCT 33.8* 28.0*  MCV 79.7* 80.5  PLT 374 280   Basic Metabolic Panel: Recent Labs  Lab 11/05/21 1330 11/06/21 0401  NA 136 133*  K 3.4* 3.2*  CL 104 107  CO2 23 21*  GLUCOSE 124* 89  BUN 6 <5*  CREATININE 0.62 0.57  CALCIUM 8.8* 8.4*  MG  --  1.7   GFR: Estimated Creatinine  Clearance: 96.5 mL/min (by C-G formula based on SCr of 0.57 mg/dL). Liver Function Tests: Recent Labs  Lab 11/05/21 1330 11/06/21 0401  AST 37 26  ALT 43 35  ALKPHOS 80 62  BILITOT 1.2 1.7*  PROT 7.3 5.3*  ALBUMIN 2.7* 2.0*   Recent Labs  Lab 11/05/21 1330  LIPASE 24   No results for input(s): "AMMONIA" in the last 168 hours. Coagulation Profile: Recent Labs  Lab 11/06/21 0401  INR 1.2   Cardiac Enzymes: No results for input(s): "CKTOTAL", "CKMB", "CKMBINDEX", "TROPONINI" in the last 168 hours. BNP (last 3 results) No results for input(s): "PROBNP" in the last 8760 hours. HbA1C: No results for input(s): "HGBA1C" in the last 72 hours. CBG: Recent Labs  Lab 11/05/21 1402  GLUCAP 115*   Lipid Profile: No results for input(s): "CHOL",  "HDL", "LDLCALC", "TRIG", "CHOLHDL", "LDLDIRECT" in the last 72 hours. Thyroid Function Tests: No results for input(s): "TSH", "T4TOTAL", "FREET4", "T3FREE", "THYROIDAB" in the last 72 hours. Anemia Panel: No results for input(s): "VITAMINB12", "FOLATE", "FERRITIN", "TIBC", "IRON", "RETICCTPCT" in the last 72 hours. Urine analysis:    Component Value Date/Time   COLORURINE AMBER (A) 11/05/2021 1535   APPEARANCEUR HAZY (A) 11/05/2021 1535   LABSPEC 1.015 11/05/2021 1535   PHURINE >=9.0 11/05/2021 1535   GLUCOSEU NEGATIVE 11/05/2021 1535   HGBUR NEGATIVE 11/05/2021 1535   BILIRUBINUR MODERATE (A) 11/05/2021 1535   KETONESUR 15 (A) 11/05/2021 1535   PROTEINUR 100 (A) 11/05/2021 1535   NITRITE NEGATIVE 11/05/2021 1535   LEUKOCYTESUR NEGATIVE 11/05/2021 1535    Radiological Exams on Admission: US Abdomen Limited RUQ (LIVER/GB)  Result Date: 11/05/2021 CLINICAL DATA:  Abdominal pain, hemoptysis. EXAM: ULTRASOUND ABDOMEN LIMITED RIGHT UPPER QUADRANT COMPARISON:  Ultrasound September 14, 2021 and MRCP September 09, 2021 FINDINGS: Gallbladder: Cholelithiasis and sludge in a distended gallbladder. No gallbladder wall thickening or pericholecystic fluid. Positive sonographic Murphy sign noted by sonographer. Common bile duct: Diameter: 4 mm Liver: No focal lesion identified. Within normal limits in parenchymal echogenicity. Portal vein is patent on color Doppler imaging with normal direction of blood flow towards the liver. Other: None. IMPRESSION: Cholelithiasis and sludge in a distended gallbladder. No gallbladder wall thickening or pericholecystic fluid. Positive sonographic Murphy sign noted by sonographer. Findings are equivocal for acute cholecystitis. Electronically Signed   By: Maudry Mayhew M.D.   On: 11/05/2021 15:10   US OB Limited > 14 wks  Result Date: 11/05/2021 CLINICAL DATA:  Three days of decreased fetal movement EXAM: LIMITED OBSTETRIC ULTRASOUND COMPARISON:  Ultrasound September 15, 2021 FINDINGS: Number of Fetuses: 1 Heart Rate:  153 bpm Movement: Yes Presentation: Cephalic Placental Location: Anterior Previa: No Amniotic Fluid (Subjective):  Within normal limits. MATERNAL FINDINGS: Cervix:  Appears closed.  Measuring 3.3 cm in length Uterus/Adnexae: No abnormality visualized. IMPRESSION: Single viable intrauterine pregnancy with a heart rate of 153 beats per minute. This exam is performed on an emergent basis and does not comprehensively evaluate fetal size, dating, or anatomy; follow-up complete OB US should be considered if further fetal assessment is warranted. Electronically Signed   By: Maudry Mayhew M.D.   On: 11/05/2021 15:07     EKG: Independently reviewed, with result as described above.    Assessment/Plan   Principal Problem:   RUQ abdominal pain Active Problems:   Hypokalemia   Chronic anemia      #) Right upper quadrant abdominal discomfort: Associated with postprandial exacerbation as well as worsening with palpation and associated with nausea/vomiting, with  current differential including biliary colic versus acute cholecystitis in the setting of right upper quadrant ultrasound today at showed cholelithiasis/sludge and a distended gallbladder, but no evidence of wall thickening, pericholecystic fluid, choledocholithiasis, or dilation of the common bile duct, while noting a positive sonographic Murphy sign, with overall findings felt to be equivocal for acute cholecystitis.  [facility]P at [facility] reportedly discussed patient's case and imaging with on-call general surgery, who will formally consult, with additional recommendations pending at this time.  Typically had ischemia be an option for further discernment between biliary colic versus acute cholecystitis, although in the setting of current pregnancy, I am not sure that this is an option.  Awaiting further recommendations from general surgery at this time of note, clinically, no evidence to  suggest a sending cholangitis, in the absence of any evidence of fever or jaundice, will also noting none elevated liver enzymes.  Plan: NPO.  She has IV fluids.  Prn IV fentanyl.  As needed Zofran.  Check INR.  General surgery consulted, with additional recommendations pending at this time, as further detailed above.  Repeat CMP/CBC in the morning.  Add on serum magnesium level. Given findings equivocal for acute cholecystitis in this patient without fever and no clinical evidence to suggest a sending cholangitis, will refrain from additional IV antibiotics for now.          #) Hypokalemia: Presenting serum potassium level found to be mildly low at 3.4, likely basis of recent increase in GI losses, as above.  Plan: Continuous lactated Ringer's, as above.  Add on serum magnesium level.  Pete CMP in the morning.  Prn IV Zofran.          #) Active Pregnancy: G4P3 female currently [redacted] weeks pregnant.  [place] discussed patient's case and imaging with the on-call OB/GYN, Dr. Rip Harbour, Who formally evaluated the patient in person today, including findings of a reassuring fetal heart monitoring, as further quantified/described above.  Dr. Adalberto Ill the patient for medical admission.  May benefit from additional OB/GYN input during this hospitalization.   Plan: On-call OB/GYN evaluation, as above, with the possibility of additional input during hospitalization following the aforementioned clearance for medical admission as above.           #) Chronic normocytic anemia: Documented history of such, with chart review revealing baseline hemoglobin range of 10-12, with presenting hemoglobin found to be consistent with his baseline range.  Not on any blood thinners as an outpatient.  Plan: Repeat CBC in the morning.  Check INR.      DVT prophylaxis: SCD's   Code Status: Full code Family Communication: none Disposition Plan: Per Rounding Team Consults called: mchp  EDP discussed with on-call OB-Gyn, Dr. Rip Harbour, who formally evaluated the patient in person today, as further detailed above; additionally, [place] also reportedly discussed patient's case and imaging with the on-call general surgeon, who will formally consult, as further detailed above  Admission status: inpatient    PLEASE NOTE THAT DRAGON DICTATION SOFTWARE WAS USED IN THE CONSTRUCTION OF THIS NOTE.   [place] DO [facility]  From [place]

## 2021-11-06 NOTE — Anesthesia Preprocedure Evaluation (Addendum)
Anesthesia Evaluation  Patient identified by MRN, date of birth, ID band Patient awake    Reviewed: Allergy & Precautions, NPO status , Patient's Chart, lab work & pertinent test results  Airway Mallampati: III  TM Distance: >3 FB Neck ROM: Full    Dental  (+) Teeth Intact, Dental Advisory Given   Pulmonary neg pulmonary ROS   breath sounds clear to auscultation       Cardiovascular negative cardio ROS  Rhythm:Regular Rate:Normal     Neuro/Psych negative neurological ROS  negative psych ROS   GI/Hepatic Neg liver ROS,GERD  Medicated,,  Endo/Other   Hyperthyroidism   Renal/GU negative Renal ROS     Musculoskeletal negative musculoskeletal ROS (+)    Abdominal   Peds  Hematology negative hematology ROS (+)   Anesthesia Other Findings   Reproductive/Obstetrics (+) Pregnancy                             Anesthesia Physical Anesthesia Plan  ASA: 2 and emergent  Anesthesia Plan: General   Post-op Pain Management:    Induction: Intravenous  PONV Risk Score and Plan: 4 or greater and Ondansetron and Dexamethasone  Airway Management Planned: Oral ETT  Additional Equipment: None  Intra-op Plan:   Post-operative Plan: Extubation in OR  Informed Consent: I have reviewed the patients History and Physical, chart, labs and discussed the procedure including the risks, benefits and alternatives for the proposed anesthesia with the patient or authorized representative who has indicated his/her understanding and acceptance.     Dental advisory given  Plan Discussed with: CRNA  Anesthesia Plan Comments: (Fetal heart tones before and after procedure. )       Anesthesia Quick Evaluation

## 2021-11-06 NOTE — Anesthesia Postprocedure Evaluation (Signed)
Anesthesia Post Note  Patient: Ashley Owen  Procedure(s) Performed: LAPAROSCOPIC CHOLECYSTECTOMY (Abdomen)     Patient location during evaluation: PACU Anesthesia Type: General Level of consciousness: awake and alert Vital Signs Assessment: post-procedure vital signs reviewed and stable Respiratory status: spontaneous breathing, nonlabored ventilation, respiratory function stable and patient connected to nasal cannula oxygen Cardiovascular status: blood pressure returned to baseline and stable Postop Assessment: no apparent nausea or vomiting Anesthetic complications: no   No notable events documented.  Last Vitals:  Vitals:   11/06/21 1315 11/06/21 1330  BP: 123/74 124/71  Pulse: (!) 107 (!) 111  Resp: (!) 21 15  Temp: (!) 36.1 C   SpO2: 100% 100%              Effie Berkshire

## 2021-11-06 NOTE — Op Note (Signed)
Patient: Ashley Owen (August 04, 1975, [id number])  Date of Surgery: 11/05/2021 - 11/06/2021   Preoperative Diagnosis: Acute Cholecystitis   Postoperative Diagnosis: Acute Cholecystitis   Surgical Procedure: LAPAROSCOPIC CHOLECYSTECTOMY:    Operative Team Members:  Surgeon(s) and Role:    * Toniyah Dilmore, Nickola Major, MD - Primary   Anesthesiologist: Effie Berkshire, MD CRNA: Reece Agar, CRNA   Anesthesia: General   Fluids:  Total I/O In: 100 [IV 0000000 Out: -   Complications: None  Drains:  none   Specimen:  ID Type Source Tests Collected by Time Destination  1 : Gallbladder Tissue PATH Gallbladder SURGICAL PATHOLOGY Stephine Langbehn, Nickola Major, MD 11/06/2021 1142      Disposition:  PACU - hemodynamically stable.  Plan of Care:  continue inpatient care    Indications for Procedure: Ashley Owen is a 46 y.o. female who presented with abdominal pain [redacted] weeks pregnant.  History, physical and imaging was concerning for cholecystitis.  Laparoscopic cholecystectomy was recommended for the patient.  The procedure itself, as well as the risks, benefits and alternatives were discussed with the patient.  Risks discussed included but were not limited to the risk of infection, bleeding, damage to nearby structures, need to convert to open procedure, incisional hernia, bile leak, common bile duct injury and the need for additional procedures or surgeries.  With this discussion complete and all questions answered the patient granted consent to proceed.  Findings: Inflamed gallbladder with gallstones  Infection status: Patient: Ashley Owen Emergency General Surgery Service Patient Case: Urgent Infection Present At Time Of Surgery (PATOS):  some spillage of bile while performing cholecystectomy   Description of Procedure:   On the date stated above, the patient was taken to the operating room suite and placed in supine positioning.  Sequential compression devices were placed on the  lower extremities to prevent blood clots.  General endotracheal anesthesia was induced. Preoperative antibiotics were given.  The patient's abdomen was prepped and draped in the usual sterile fashion.  A time-out was completed verifying the correct patient, procedure, positioning and equipment needed for the case.  A 5 mm trocar was placed in the right upper quadrant using optical technique.  The abdomen was insufflated to 15 mmHg.  There was no trauma to underlying viscera with initial trocar placement.  The abdomen was inspected.  She did have an umbilical hernia defect.  A 5 mm trocar was placed through the rectus muscle at the level of the umbilicus avoiding the hernia defect.  I do not feel that using this hernia defect for incision would be a good idea as her abdominal wall continues to stretch over the next few months with her current pregnancy.  I put a additional subxiphoid 11 mm port site and a mid abdominal midline 5 mm port site.  All these trocars were placed under direct vision without any trauma to the underlying viscera.   The patient was then placed in head up, left side down positioning.  The gallbladder was identified and dissected free from its attachments to the omentum allowing the duodenum to fall away.  The infundibulum of the gallbladder was dissected free working laterally to medially.  The cystic duct and cystic artery were dissected free from surrounding connective tissue.  The infundibulum of the gallbladder was dissected off the cystic plate.  A critical view of safety was obtained with the cystic duct and cystic artery being cleared of connective tissues and clearly the only two structures entering into the gallbladder with  the liver clearly visible behind.  Clips were then applied to the cystic duct and cystic artery and then these structures were divided.  A PDS Endoloop was placed around the cystic duct.  The gallbladder was dissected off the cystic plate, placed in an endocatch  bag and removed from the 12 mm subxiphoid port site.  The clips were inspected and appeared effective.  The cystic plate was inspected and hemostasis was obtained using electrocautery.  A suction irrigator was used to clean the operative field.  Attention was turned to closure.  The 12 mm subxiphoid port site was closed using a 0-vicryl suture on a fascial suture passer.  The abdomen was desufflated.  The skin was closed using 4-0 monocryl and dermabond.  All sponge and needle counts were correct at the conclusion of the case.    Louanna Raw, MD General, Bariatric, & Minimally Invasive Surgery [facility], [place]

## 2021-11-06 NOTE — Transfer of Care (Signed)
Immediate Anesthesia Transfer of Care Note  Patient: Ashley Owen  Procedure(s) Performed: LAPAROSCOPIC CHOLECYSTECTOMY (Abdomen)  Patient Location: PACU  Anesthesia Type:General  Level of Consciousness: awake and alert   Airway & Oxygen Therapy: Patient Spontanous Breathing and Patient connected to face mask oxygen  Post-op Assessment: Report given to RN and Post -op Vital signs reviewed and stable  Post vital signs: Reviewed and stable  Last Vitals:  Vitals Value Taken Time  BP 125/73 11/06/21 1237  Temp 36.8 C 11/06/21 1235  Pulse 109 11/06/21 1242  Resp 21 11/06/21 1242  SpO2 100 % 11/06/21 1242  Vitals shown include unvalidated device data.  Last Pain:  Vitals:   11/06/21 1025  TempSrc:   PainSc: 8       Patients Stated Pain Goal: 3 (21/97/58 8325)  Complications: No notable events documented.

## 2021-11-06 NOTE — Consult Note (Signed)
Consulting Physician: Nickola Major Abiola Behring  Referring Provider: Dr. Velia Meyer  Chief Complaint: Abdominal pain  Reason for Consult: Cholecystitis   Subjective   HPI: Ashley Owen is an 46 y.o. female who is here for abdominal pain.  It is located in her right upper quadrant and right back.  I saw her at [redacted] weeks pregnant for similar issues and she is now [redacted] weeks pregnant representing to the hospital.  The pain is severe and she at first wanted to avoid surgery during pregnancy and now wants to have her gallbladder removed.    Past Medical History:  Diagnosis Date   GERD (gastroesophageal reflux disease)    Hyperthyroidism     History reviewed. No pertinent surgical history.  History reviewed. No pertinent family history.  Social:  reports that she has never smoked. She has never used smokeless tobacco. She reports that she does not drink alcohol and does not use drugs.  Allergies: No Known Allergies  Medications: Current Outpatient Medications  Medication Instructions   glycopyrrolate (ROBINUL) 1 MG tablet Prendre 2 comprims (2 mg au total) par voie orale 3 (trois) fois par [employer]. (Take 2 tablets (2 mg total) by mouth 3 (three) times daily.)   methylPREDNISolone (MEDROL) 4 MG tablet Prendre comme indiqu. (Take as directed)   ondansetron (ZOFRAN-ODT) 8 MG disintegrating tablet Prendre 1 comprim (8 mg au total) par voie orale toutes les 12 (douze) heures. (Take 1 tablet (8 mg total) by mouth every 12 (twelve) hours.)   pantoprazole (PROTONIX) 40 MG tablet Prendre 1 comprim (40 mg au total) par voie orale par jour. (Take 1 tablet (40 mg total) by mouth daily.)   potassium chloride SA (KLOR-CON M) 20 MEQ tablet Prendre 1 comprim (20 mEq au total) par voie orale par jour pendant 5 jours. (Take 1 tablet (20 mEq total) by mouth daily for 5 days.)   promethazine (PHENERGAN) 25 MG tablet Prendre 1 comprim (25 mg au total) par voie orale toutes les 6 (six) heures, au besoin, en cas  de nauses ou de vomissements. (Take 1 tablet (25 mg total) by mouth every 6 (six) hours as needed for nausea or vomiting.)    ROS - all of the below systems have been reviewed with the patient and positives are indicated with bold text General: chills, fever or night sweats Eyes: blurry vision or double vision ENT: epistaxis or sore throat Allergy/Immunology: itchy/watery eyes or nasal congestion Hematologic/Lymphatic: bleeding problems, blood clots or swollen lymph nodes Endocrine: temperature intolerance or unexpected weight changes Breast: new or changing breast lumps or nipple discharge Resp: cough, shortness of breath, or wheezing CV: chest pain or dyspnea on exertion GI: as per HPI GU: dysuria, trouble voiding, or hematuria MSK: joint pain or joint stiffness Neuro: TIA or stroke symptoms Derm: pruritus and skin lesion changes Psych: anxiety and depression  Objective   PE Blood pressure (!) 90/55, pulse 95, temperature 98.4 F (36.9 C), temperature source Oral, resp. rate 18, height 5\' 5"  (1.651 m), weight 88.5 kg, SpO2 100 %. Constitutional: NAD; conversant; no deformities Eyes: Moist conjunctiva; no lid lag; anicteric; PERRL Neck: Trachea midline; no thyromegaly Lungs: Normal respiratory effort; no tactile fremitus CV: RRR; no palpable thrills; no pitting edema GI: Abd Tender RUQ, positive murphys sign MSK: Normal range of motion of extremities; no clubbing/cyanosis Psychiatric: Appropriate affect; alert and oriented x3 Lymphatic: No palpable cervical or axillary lymphadenopathy  Results for orders placed or performed during the hospital encounter of 11/05/21 (from the past  24 hour(s))  CBC with Differential     Status: Abnormal   Collection Time: 11/05/21  1:30 PM  Result Value Ref Range   WBC 12.6 (H) 4.0 - 10.5 K/uL   RBC 4.24 3.87 - 5.11 MIL/uL   Hemoglobin 11.0 (L) 12.0 - 15.0 g/dL   HCT 97.6 (L) 73.4 - 19.3 %   MCV 79.7 (L) 80.0 - 100.0 fL   MCH 25.9 (L) 26.0  - 34.0 pg   MCHC 32.5 30.0 - 36.0 g/dL   RDW 79.0 24.0 - 97.3 %   Platelets 374 150 - 400 K/uL   nRBC 0.0 0.0 - 0.2 %   Neutrophils Relative % 83 %   Neutro Abs 10.4 (H) 1.7 - 7.7 K/uL   Lymphocytes Relative 11 %   Lymphs Abs 1.4 0.7 - 4.0 K/uL   Monocytes Relative 4 %   Monocytes Absolute 0.5 0.1 - 1.0 K/uL   Eosinophils Relative 1 %   Eosinophils Absolute 0.1 0.0 - 0.5 K/uL   Basophils Relative 0 %   Basophils Absolute 0.0 0.0 - 0.1 K/uL   Immature Granulocytes 1 %   Abs Immature Granulocytes 0.09 (H) 0.00 - 0.07 K/uL  Comprehensive metabolic panel     Status: Abnormal   Collection Time: 11/05/21  1:30 PM  Result Value Ref Range   Sodium 136 135 - 145 mmol/L   Potassium 3.4 (L) 3.5 - 5.1 mmol/L   Chloride 104 98 - 111 mmol/L   CO2 23 22 - 32 mmol/L   Glucose, Bld 124 (H) 70 - 99 mg/dL   BUN 6 6 - 20 mg/dL   Creatinine, Ser 5.32 0.44 - 1.00 mg/dL   Calcium 8.8 (L) 8.9 - 10.3 mg/dL   Total Protein 7.3 6.5 - 8.1 g/dL   Albumin 2.7 (L) 3.5 - 5.0 g/dL   AST 37 15 - 41 U/L   ALT 43 0 - 44 U/L   Alkaline Phosphatase 80 38 - 126 U/L   Total Bilirubin 1.2 0.3 - 1.2 mg/dL   GFR, Estimated >99 >24 mL/min   Anion gap 9 5 - 15  Lipase, blood     Status: None   Collection Time: 11/05/21  1:30 PM  Result Value Ref Range   Lipase 24 11 - 51 U/L  POC CBG, ED     Status: Abnormal   Collection Time: 11/05/21  2:02 PM  Result Value Ref Range   Glucose-Capillary 115 (H) 70 - 99 mg/dL  Resp Panel by RT-PCR (Flu A&B, Covid) Anterior Nasal Swab     Status: None   Collection Time: 11/05/21  2:07 PM   Specimen: Anterior Nasal Swab  Result Value Ref Range   SARS Coronavirus 2 by RT PCR NEGATIVE NEGATIVE   Influenza A by PCR NEGATIVE NEGATIVE   Influenza B by PCR NEGATIVE NEGATIVE  Urinalysis, Routine w reflex microscopic Urine, Clean Catch     Status: Abnormal   Collection Time: 11/05/21  3:35 PM  Result Value Ref Range   Color, Urine AMBER (A) YELLOW   APPearance HAZY (A) CLEAR    Specific Gravity, Urine 1.015 1.005 - 1.030   pH >=9.0 5.0 - 8.0   Glucose, UA NEGATIVE NEGATIVE mg/dL   Hgb urine dipstick NEGATIVE NEGATIVE   Bilirubin Urine MODERATE (A) NEGATIVE   Ketones, ur 15 (A) NEGATIVE mg/dL   Protein, ur 268 (A) NEGATIVE mg/dL   Nitrite NEGATIVE NEGATIVE   Leukocytes,Ua NEGATIVE NEGATIVE  Urinalysis, Microscopic (reflex)  Status: Abnormal   Collection Time: 11/05/21  3:35 PM  Result Value Ref Range   RBC / HPF 0-5 0 - 5 RBC/hpf   WBC, UA NONE SEEN 0 - 5 WBC/hpf   Bacteria, UA RARE (A) NONE SEEN   Squamous Epithelial / LPF 6-10 0 - 5   Mucus PRESENT   CBC with Differential/Platelet     Status: Abnormal   Collection Time: 11/06/21  4:01 AM  Result Value Ref Range   WBC 9.3 4.0 - 10.5 K/uL   RBC 3.48 (L) 3.87 - 5.11 MIL/uL   Hemoglobin 9.0 (L) 12.0 - 15.0 g/dL   HCT 85.0 (L) 27.7 - 41.2 %   MCV 80.5 80.0 - 100.0 fL   MCH 25.9 (L) 26.0 - 34.0 pg   MCHC 32.1 30.0 - 36.0 g/dL   RDW 87.8 67.6 - 72.0 %   Platelets 280 150 - 400 K/uL   nRBC 0.0 0.0 - 0.2 %   Neutrophils Relative % 72 %   Neutro Abs 6.7 1.7 - 7.7 K/uL   Lymphocytes Relative 19 %   Lymphs Abs 1.8 0.7 - 4.0 K/uL   Monocytes Relative 6 %   Monocytes Absolute 0.5 0.1 - 1.0 K/uL   Eosinophils Relative 2 %   Eosinophils Absolute 0.2 0.0 - 0.5 K/uL   Basophils Relative 0 %   Basophils Absolute 0.0 0.0 - 0.1 K/uL   Immature Granulocytes 1 %   Abs Immature Granulocytes 0.09 (H) 0.00 - 0.07 K/uL  Comprehensive metabolic panel     Status: Abnormal   Collection Time: 11/06/21  4:01 AM  Result Value Ref Range   Sodium 133 (L) 135 - 145 mmol/L   Potassium 3.2 (L) 3.5 - 5.1 mmol/L   Chloride 107 98 - 111 mmol/L   CO2 21 (L) 22 - 32 mmol/L   Glucose, Bld 89 70 - 99 mg/dL   BUN <5 (L) 6 - 20 mg/dL   Creatinine, Ser 9.47 0.44 - 1.00 mg/dL   Calcium 8.4 (L) 8.9 - 10.3 mg/dL   Total Protein 5.3 (L) 6.5 - 8.1 g/dL   Albumin 2.0 (L) 3.5 - 5.0 g/dL   AST 26 15 - 41 U/L   ALT 35 0 - 44 U/L    Alkaline Phosphatase 62 38 - 126 U/L   Total Bilirubin 1.7 (H) 0.3 - 1.2 mg/dL   GFR, Estimated >09 >62 mL/min   Anion gap 5 5 - 15  Magnesium     Status: None   Collection Time: 11/06/21  4:01 AM  Result Value Ref Range   Magnesium 1.7 1.7 - 2.4 mg/dL  Protime-INR     Status: None   Collection Time: 11/06/21  4:01 AM  Result Value Ref Range   Prothrombin Time 14.8 11.4 - 15.2 seconds   INR 1.2 0.8 - 1.2     Imaging Orders         US OB Limited > 14 wks         US Abdomen Limited RUQ (LIVER/GB)      Assessment and Plan   Ashley Owen is a [redacted] week pregnant, 46 y.o. female with cholecystitis.  I recommended laparoscopic cholecystectomy.  The procedure, its risks, benefits and alternatives were discussed.  We discussed the risks to the patient and the baby.  After a full discussion and all questions answered the patient granted consent to proceed.  We will proceed to the OR as time allows.  OB was contacted by  ER provider and stated they did not need to see the patient as the pregnancy is not yet viable.    ICD-10-CM   1. Cholecystitis  K81.9        Quentin Ore, MD  [facility] Use [email or web address] to contact on call provider  New Patient Billing: [phone] - High MDM

## 2021-11-06 NOTE — Anesthesia Procedure Notes (Signed)
Procedure Name: Intubation Date/Time: 11/06/2021 11:32 AM  Performed by: Reece Agar, CRNAPre-anesthesia Checklist: Patient identified, Emergency Drugs available, Suction available and Patient being monitored Patient Re-evaluated:Patient Re-evaluated prior to induction Oxygen Delivery Method: Circle System Utilized Preoxygenation: Pre-oxygenation with 100% oxygen Induction Type: IV induction, Rapid sequence and Cricoid Pressure applied Ventilation: Unable to mask ventilate Laryngoscope Size: Mac and 4 Grade View: Grade I Tube type: Oral Tube size: 7.0 mm Number of attempts: 1 Airway Equipment and Method: Stylet Placement Confirmation: ETT inserted through vocal cords under direct vision, positive ETCO2 and breath sounds checked- equal and bilateral Secured at: 22 cm Tube secured with: Tape Dental Injury: Teeth and Oropharynx as per pre-operative assessment

## 2021-11-06 NOTE — Discharge Instructions (Signed)
 CHOLECYSTECTOMY POST OPERATIVE INSTRUCTIONS  Thinking Clearly  The anesthesia may cause you to feel different for 1 or 2 days. Do not drive, drink alcohol, or make any big decisions for at least 2 days.  Nutrition When you wake up, you will be able to drink small amounts of liquid. If you do not feel sick, you can slowly advance your diet to regular foods. Continue to drink lots of fluids, usually about 8 to 10 glasses per day. Eat a high-fiber diet so you don't strain during bowel movements. High-Fiber Foods Foods high in fiber include beans, bran cereals and whole-grain breads, peas, dried fruit (figs, apricots, and dates), raspberries, blackberries, strawberries, sweet corn, broccoli, baked potatoes with skin, plums, pears, apples, greens, and nuts. Activity Slowly increase your activity. Be sure to get up and walk every hour or so to prevent blood clots. No heavy lifting or strenuous activity for 4 weeks following surgery to prevent hernias at your incision sites It is normal to feel tired. You may need more sleep than usual.  Get your rest but make sure to get up and move around frequently to prevent blood clots and pneumonia.  Work and Return to School You can go back to work when you feel well enough. Discuss the timing with your surgeon. You can usually go back to school or work 1 week after an operation. If your work requires heavy lifting or strenuous activity you need to be placed on light duty for 4 weeks following surgery. You can return to gym class, sports or other physical activities 4 weeks after surgery.  Wound Care Always wash your hands before and after touching near your incision site. Do not soak in a bathtub until cleared at your follow up appointment. You may take a shower 24 hours after surgery. A small amount of drainage from the incision is normal. If the drainage is thick and yellow or the site is red, you may have an infection, so call your surgeon. If you  have a drain in one of your incisions, it will be taken out in office when the drainage stops. Steri-Strips will fall off in 7 to 10 days or they will be removed during your first office visit. If you have dermabond glue covering over the incision, allow the glue to flake off on its own. Avoid wearing tight or rough clothing. It may rub your incisions and make it harder for them to heal. Protect the new skin, especially from the sun. The sun can burn and cause darker scarring. Your scar will heal in about 4 to 6 weeks and will become softer and continue to fade over the next year.  The cosmetic appearance of the incisions will improve over the course of the first year after surgery. Sensation around your incision will return in a few weeks or months.  Bowel Movements After intestinal surgery, you may have loose watery stools for several days. If watery diarrhea lasts longer than 3 days, contact your surgeon. Pain medication (narcotics) can cause constipation. Increase the fiber in your diet with high-fiber foods if you are constipated. You can take an over the counter stool softener like Colace to avoid constipation.  Additional over the counter medications can also be used if Colace isn't sufficient (for example, Milk of Magnesia or Miralax).  Pain The amount of pain is different for each person. Some people need only 1 to 3 doses of pain control medication, while others need more. Take alternating doses of tylenol   and ibuprofen around the clock for the first five days following surgery.  This will provide a baseline of pain control and help with inflammation.  Take the narcotic pain medication in addition if needed for severe pain.  Contact Your Surgeon at [phone], if you have: Pain in your right upper abdomen like a gallbladder attack. Pain that will not go away Pain that gets worse A fever of more than 101F (38.3C) Repeated vomiting Swelling, redness, bleeding, or bad-smelling  drainage from your wound site Strong abdominal pain No bowel movement or unable to pass gas for 3 days Watery diarrhea lasting longer than 3 days  Pain Control The goal of pain control is to minimize pain, keep you moving and help you heal. Your surgical team will work with you on your pain plan. Most often a combination of therapies and medications are used to control your pain. You may also be given medication (local anesthetic) at the surgical site. This may help control your pain for several days. Extreme pain puts extra stress on your body at a time when your body needs to focus on healing. Do not wait until your pain has reached a level "10" or is unbearable before telling your doctor or nurse. It is much easier to control pain before it becomes severe. Following a laparoscopic procedure, pain is sometimes felt in the shoulder. This is due to the gas inserted into your abdomen during the procedure. Moving and walking helps to decrease the gas and the right shoulder pain.  Use the guide below for ways to manage your post-operative pain. Learn more by going to facs.org/safepaincontrol.  How Intense Is My Pain Common Therapies to Feel Better       I hardly notice my pain, and it does not interfere with my activities.  I notice my pain and it distracts me, but I can still do activities (sitting up, walking, standing).  Non-Medication Therapies  Ice (in a bag, applied over clothing at the surgical site), elevation, rest, meditation, massage, distraction (music, TV, play) walking and mild exercise Splinting the abdomen with pillows +  Non-Opioid Medications Acetaminophen (Tylenol) Non-steroidal anti-inflammatory drugs (NSAIDS) Aspirin, Ibuprofen (Motrin, Advil) Naproxen (Aleve) Take these as needed, when you feel pain. Both acetaminophen and NSAIDs help to decrease pain and swelling (inflammation).      My pain is hard to ignore and is more noticeable even when I rest.  My  pain interferes with my usual activities.  Non-Medication Therapies  +  Non-Opioid medications  Take on a regular schedule (around-the-clock) instead of as needed. (For example, Tylenol every 6 hours at 9:00 am, 3:00 pm, 9:00 pm, 3:00 am and Motrin every 6 hours at 12:00 am, 6:00 am, 12:00 pm, 6:00 pm)         I am focused on my pain, and I am not doing my daily activities.  I am groaning in pain, and I cannot sleep. I am unable to do anything.  My pain is as bad as it could be, and nothing else matters.  Non-Medication Therapies  +  Around-the-Clock Non-Opioid Medications  +  Short-acting opioids  Opioids should be used with other medications to manage severe pain. Opioids block pain and give a feeling of euphoria (feel high). Addiction, a serious side effect of opioids, is rare with short-term (a few days) use.  Examples of short-acting opioids include: Tramadol (Ultram), Hydrocodone (Norco, Vicodin), Hydromorphone (Dilaudid), Oxycodone (Oxycontin)     The above directions have been adapted from   the American College of Surgeons Surgical Patient Education Program.  Please refer to the ACS website if needed: https://www.facs.org/-/media/files/education/patient-ed/cholesys.ashx.   Kenae Lindquist, MD [facility] [address], [address], [place], [place]  [address] ?  [address] [address], [place], [place]   [address] [phone] ? [phone] ? FAX [phone] Web site: [email or web address]  

## 2021-11-07 ENCOUNTER — Encounter (HOSPITAL_COMMUNITY): Payer: Self-pay | Admitting: Surgery

## 2021-11-07 DIAGNOSIS — R1011 Right upper quadrant pain: Secondary | ICD-10-CM | POA: Diagnosis not present

## 2021-11-07 LAB — RENAL FUNCTION PANEL
Albumin: 2.3 g/dL — ABNORMAL LOW (ref 3.5–5.0)
Anion gap: 12 (ref 5–15)
BUN: <5 mg/dL — ABNORMAL LOW (ref 6–20)
CO2: 19 mmol/L — ABNORMAL LOW (ref 22–32)
Calcium: 8.8 mg/dL — ABNORMAL LOW (ref 8.9–10.3)
Chloride: 102 mmol/L (ref 98–111)
Creatinine, Ser: 0.59 mg/dL (ref 0.44–1.00)
GFR, Estimated: >60 mL/min (ref >60)
Glucose, Bld: 90 mg/dL (ref 70–99)
Phosphorus: 3.2 mg/dL (ref 2.5–4.6)
Potassium: 3.8 mmol/L (ref 3.5–5.1)
Sodium: 133 mmol/L — ABNORMAL LOW (ref 135–145)

## 2021-11-07 LAB — CBC
HCT: 30.4 % — ABNORMAL LOW (ref 36.0–46.0)
Hemoglobin: 9.6 g/dL — ABNORMAL LOW (ref 12.0–15.0)
MCH: 26.1 pg (ref 26.0–34.0)
MCHC: 31.6 g/dL (ref 30.0–36.0)
MCV: 82.6 fL (ref 80.0–100.0)
Platelets: 301 10*3/uL (ref 150–400)
RBC: 3.68 MIL/uL — ABNORMAL LOW (ref 3.87–5.11)
RDW: 12.3 % (ref 11.5–15.5)
WBC: 10.9 10*3/uL — ABNORMAL HIGH (ref 4.0–10.5)
nRBC: 0 % (ref 0.0–0.2)

## 2021-11-07 LAB — HEPATIC FUNCTION PANEL
ALT: 42 U/L (ref 0–44)
AST: 41 U/L (ref 15–41)
Albumin: 2.2 g/dL — ABNORMAL LOW (ref 3.5–5.0)
Alkaline Phosphatase: 69 U/L (ref 38–126)
Bilirubin, Direct: 1.3 mg/dL — ABNORMAL HIGH (ref 0.0–0.2)
Indirect Bilirubin: 1.2 mg/dL — ABNORMAL HIGH (ref 0.3–0.9)
Total Bilirubin: 2.5 mg/dL — ABNORMAL HIGH (ref 0.3–1.2)
Total Protein: 5.6 g/dL — ABNORMAL LOW (ref 6.5–8.1)

## 2021-11-07 LAB — MAGNESIUM: Magnesium: 1.5 mg/dL — ABNORMAL LOW (ref 1.7–2.4)

## 2021-11-07 MED ORDER — MAGNESIUM SULFATE 2 GM/50ML IV SOLN
2.0000 g | Freq: Once | INTRAVENOUS | Status: AC
  Administered 2021-11-07: 2 g via INTRAVENOUS
  Filled 2021-11-07: qty 50

## 2021-11-07 MED ORDER — POTASSIUM CHLORIDE CRYS ER 20 MEQ PO TBCR: 40.0000 meq | EXTENDED_RELEASE_TABLET | ORAL | Status: DC

## 2021-11-07 MED ORDER — POTASSIUM CHLORIDE IN NACL 20-0.9 MEQ/L-% IV SOLN
INTRAVENOUS | Status: DC
  Filled 2021-11-07 (×3): qty 1000

## 2021-11-07 NOTE — Progress Notes (Signed)
Progress Note: General Surgery Service   Chief Complaint/Subjective: Pain RUQ and around incisions  Objective: Vital signs in last 24 hours: Temp:  [97 F (36.1 C)-98.6 F (37 C)] 98 F (36.7 C) (11/05 0742) Pulse Rate:  [91-111] 102 (11/05 0742) Resp:  [15-22] 18 (11/05 0742) BP: (103-127)/(69-80) 103/80 (11/05 0742) SpO2:  [100 %] 100 % (11/05 0742) Weight:  [88.5 kg] 88.5 kg (11/04 1025) Last BM Date :  (pta)  Intake/Output from previous day: 11/04 0701 - 11/05 0700 In: 2531 [P.O.:220; I.V.:2101; IV Piggyback:210] Out: 340 [Blood:340] Intake/Output this shift: No intake/output data recorded.  Constitutional: NAD; conversant; no deformities Eyes: Moist conjunctiva; no lid lag; anicteric; PERRL Neck: Trachea midline; no thyromegaly Lungs: Normal respiratory effort; no tactile fremitus CV: RRR; no palpable thrills; no pitting edema GI: Abd soft, some RUQ tenderness; no palpable hepatosplenomegaly MSK: Normal range of motion of extremities; no clubbing/cyanosis Psychiatric: Appropriate affect; alert and oriented x3 Lymphatic: No palpable cervical or axillary lymphadenopathy  Lab Results: CBC  Recent Labs    11/06/21 0401 11/07/21 0236  WBC 9.3 10.9*  HGB 9.0* 9.6*  HCT 28.0* 30.4*  PLT 280 301   BMET Recent Labs    11/05/21 1330 11/06/21 0401  NA 136 133*  K 3.4* 3.2*  CL 104 107  CO2 23 21*  GLUCOSE 124* 89  BUN 6 <5*  CREATININE 0.62 0.57  CALCIUM 8.8* 8.4*   PT/INR Recent Labs    11/06/21 0401  LABPROT 14.8  INR 1.2   ABG No results for input(s): "PHART", "HCO3" in the last 72 hours.  Invalid input(s): "PCO2", "PO2"  Anti-infectives: Anti-infectives (From admission, onward)    Start     Dose/Rate Route Frequency Ordered Stop   11/06/21 1100  ceFAZolin (ANCEF) IVPB 2g/100 mL premix        2 g 200 mL/hr over 30 Minutes Intravenous Once 11/06/21 1000 11/06/21 1152   11/06/21 0954  ceFAZolin (ANCEF) 2-4 GM/100ML-% IVPB       Note to  Pharmacy: Jasmine Pang K: cabinet override      11/06/21 0954 11/06/21 1154   11/05/21 1530  cefTRIAXone (ROCEPHIN) 2 g in sodium chloride 0.9 % 100 mL IVPB        2 g 200 mL/hr over 30 Minutes Intravenous  Once 11/05/21 1523 11/05/21 1607       Medications: Scheduled Meds:  acetaminophen  650 mg Oral Q6H   docusate sodium  100 mg Oral BID   Continuous Infusions:  lactated ringers 100 mL/hr at 11/07/21 0426   methocarbamol (ROBAXIN) IV Stopped (11/07/21 0042)   PRN Meds:.acetaminophen **OR** acetaminophen, fentaNYL (SUBLIMAZE) injection, HYDROmorphone (DILAUDID) injection, methocarbamol (ROBAXIN) IV, naLOXone (NARCAN)  injection, ondansetron (ZOFRAN) IV, ondansetron (ZOFRAN) IV, oxyCODONE, oxyCODONE, prochlorperazine, simethicone  Assessment/Plan: s/p Procedure(s): LAPAROSCOPIC CHOLECYSTECTOMY 11/06/2021  Doing well POD1 Advance diet as tolerated Okay for discharge from general surgery perspective   LOS: 2 days   Felicie Morn, MD  [facility] Use [email or web address] to contact on call provider  Daily Billing: [phone] - post op

## 2021-11-07 NOTE — Progress Notes (Signed)
Patient seen resting in bed voiced that she is feeling better pain has subsided. Pending discharge to [facility] for cardiac monitoring.Unit called waiting for charge nurse to call back.

## 2021-11-07 NOTE — Progress Notes (Signed)
Consultation Progress Note   Patient: Ashley Owen ONG:[id number] DOB: 1975/03/10 DOA: 11/05/2021 DOS: the patient was seen and examined on 11/07/2021 Primary service: Bonnell Public, MD  Brief hospital course: Patient is a 46 y.o. G4P58female [redacted] weeks pregnant with medical history significant for chronic anemia associated baseline hemoglobin 10-12 who is admitted to [facility] on 11/05/2021 by way of transfer from [place] for further evaluation and management of presenting right upper quadrant abdominal discomfort.  11/07/2021: Patient underwent laparoscopic cholecystectomy on 11/06/2021.  Inflamed gallbladder with gallstones was found.  Surgical team is cleared patient for discharge.  However, patient has continued to have nausea and vomiting.  Patient has vomited 5-6 times today.  There is significant electrolyte abnormality.  Patient's potassium is 3.2.  Will replete patient's potassium.  Magnesium is 1.7.  Will give 2 g of mag IV x1 dose.  Will place patient on telemetry.  We will repeat renal panel and magnesium stat, as well as, in the morning.  We will change patient to inpatient.  EKG done on 11/05/2021 reviewed, QTc interval was 436 ms.  We will continue to avoid or minimize QTc prolonging medication.  Patient was seen alongside patient's husband.  Assessment and Plan: Principal Problem: Acute cholecystectomy Active Problems:   Hypokalemia   Chronic anemia  Acute cholecystitis: -s/p laparoscopic cholecystectomy -Postop management as per surgical team. -Incentive spirometry.  Hypokalemia: -Potassium is 3.2. -K-Dur 40 mEq every 4 hours x 2 doses. -IV magnesium 2 g x 1 dose. -Last magnesium was 1.7. -Repeat renal panel and magnesium in the morning.   -Place patient on telemetry. -Change patient to inpatient.  Nausea and vomiting: -Manage supportively.   Active Pregnancy: -G4P3 female currently [redacted] weeks pregnant.  [place] discussed  patient's case and imaging with the on-call OB/GYN, Dr. Rip Harbour,   Chronic normocytic anemia: -Hemoglobin is 9.6 g/dL. -We will check iron panel. -Patient will need iron considering the patient is pregnant.       [facility] will continue to follow the patient.  Subjective: s/p laparoscopic cholecystectomy   Physical Exam: General: appears to be stated age; alert, oriented Skin: warm, dry, no rash Mouth:  Oral mucosa membranes appear dry, normal dentition Neck: supple; trachea midline Heart:  RRR; did not appreciate any M/R/G Lungs: CTAB, did not appreciate any wheezes, rales, or rhonchi Abdomen: + BS; soft, ND, mild tenderness No guarding, rigidity, or rebound tenderness Vascular: 2+ pedal pulses b/l; 2+ radial pulses b/l Extremities: no peripheral edema, no muscle wasting Neuro: strength and sensation intact in upper and lower extremities b/l Vitals:   11/06/21 1950 11/07/21 0429 11/07/21 0742 11/07/21 1643  BP: 112/75 113/76 103/80 117/76  Pulse: 98 99 (!) 102 99  Resp: 18 16 18 18   Temp: 98.6 F (37 C) 98 F (36.7 C) 98 F (36.7 C) 98.2 F (36.8 C)  TempSrc: Oral Oral Oral Oral  SpO2: 100% 100% 100% 100%  Weight:      Height:       Data Reviewed:  There are no new results to review at this time.  Family Communication:   Time spent: 55 minutes.  Author: Bonnell Public, MD 11/07/2021 5:35 PM  For on call review [email or web address].

## 2021-11-07 NOTE — Plan of Care (Signed)

## 2021-11-07 NOTE — Progress Notes (Signed)
Pt porepared and left unit for with float nurse for Veteran.

## 2021-11-08 ENCOUNTER — Inpatient Hospital Stay (HOSPITAL_COMMUNITY): Payer: Medicaid Other

## 2021-11-08 DIAGNOSIS — R1011 Right upper quadrant pain: Secondary | ICD-10-CM | POA: Diagnosis not present

## 2021-11-08 LAB — CBC
HCT: 28.3 % — ABNORMAL LOW (ref 36.0–46.0)
Hemoglobin: 9.4 g/dL — ABNORMAL LOW (ref 12.0–15.0)
MCH: 26.4 pg (ref 26.0–34.0)
MCHC: 33.2 g/dL (ref 30.0–36.0)
MCV: 79.5 fL — ABNORMAL LOW (ref 80.0–100.0)
Platelets: 305 10*3/uL (ref 150–400)
RBC: 3.56 MIL/uL — ABNORMAL LOW (ref 3.87–5.11)
RDW: 12.3 % (ref 11.5–15.5)
WBC: 7.1 10*3/uL (ref 4.0–10.5)
nRBC: 0 % (ref 0.0–0.2)

## 2021-11-08 LAB — RENAL FUNCTION PANEL
Albumin: 2.1 g/dL — ABNORMAL LOW (ref 3.5–5.0)
Anion gap: 8 (ref 5–15)
BUN: <5 mg/dL — ABNORMAL LOW (ref 6–20)
CO2: 21 mmol/L — ABNORMAL LOW (ref 22–32)
Calcium: 8.4 mg/dL — ABNORMAL LOW (ref 8.9–10.3)
Chloride: 103 mmol/L (ref 98–111)
Creatinine, Ser: 0.5 mg/dL (ref 0.44–1.00)
GFR, Estimated: >60 mL/min (ref >60)
Glucose, Bld: 104 mg/dL — ABNORMAL HIGH (ref 70–99)
Phosphorus: 3 mg/dL (ref 2.5–4.6)
Potassium: 3.5 mmol/L (ref 3.5–5.1)
Sodium: 132 mmol/L — ABNORMAL LOW (ref 135–145)

## 2021-11-08 LAB — MAGNESIUM: Magnesium: 1.8 mg/dL (ref 1.7–2.4)

## 2021-11-08 LAB — HEPATIC FUNCTION PANEL
ALT: 76 U/L — ABNORMAL HIGH (ref 0–44)
AST: 83 U/L — ABNORMAL HIGH (ref 15–41)
Albumin: 2.1 g/dL — ABNORMAL LOW (ref 3.5–5.0)
Alkaline Phosphatase: 65 U/L (ref 38–126)
Bilirubin, Direct: 1.3 mg/dL — ABNORMAL HIGH (ref 0.0–0.2)
Indirect Bilirubin: 1.1 mg/dL — ABNORMAL HIGH (ref 0.3–0.9)
Total Bilirubin: 2.4 mg/dL — ABNORMAL HIGH (ref 0.3–1.2)
Total Protein: 5.4 g/dL — ABNORMAL LOW (ref 6.5–8.1)

## 2021-11-08 NOTE — TOC Progression Note (Signed)
Transition of Care [facility]) - Progression Note    Patient Details  Name: Ashley Owen MRN: [id number] Date of Birth: 23-Nov-1975  Transition of Care [facility]) CM/SW Contact  Zenon Mayo, RN Phone Number: 11/08/2021, 2:52 PM  Clinical Narrative:    from home,  abd pain, s/p lap chole, [redacted] weeks pregnant, ivf at 21, monitoring.  TOC following.        Expected Discharge Plan and Services                                                 Social Determinants of Health (SDOH) Interventions    Readmission Risk Interventions     No data to display

## 2021-11-08 NOTE — Progress Notes (Signed)
PROGRESS NOTE    Ashley Owen  YQI:[id number] DOB: 1975-08-29 DOA: 11/05/2021 PCP: [facility]   Brief Narrative: This 46 y.o. G4P3 female who is [redacted] weeks pregnant with medical history significant for chronic anemia with associated baseline hemoglobin 10-12 range who is admitted to [facility] on 11/05/2021 for further evaluation and management of right upper quadrant abdominal discomfort.   11/08/2021: Patient underwent laparoscopic cholecystectomy on 11/06/2021.  Inflamed gallbladder with gallstones was found.  Patient cleared from surgical team to be discharged.  LFTs trended up,  post surgery this could be secondary to cholestasis of pregnancy.  MRCP is ordered to rule out biliary cholangitis.  Assessment & Plan:   Principal Problem:   RUQ abdominal pain Active Problems:   Acute cholecystitis   Hypokalemia   Chronic anemia  Acute cholecystitis: -s/p laparoscopic cholecystectomy 11/06/21. -Postop management as per surgical team. -Incentive spirometry. -Patient cleared from surgery to be discharged. -LFTs trended up,  MRCP is ordered to rule out cholangitis.   Hypokalemia / Hypomagnesemia: Potassium 3.2 on arrival. Magnesium -1.7 Replaced and resolved.   Nausea and vomiting: Resolved.  Improved.   Intrauterine pregnancy: G4P3 female currently [redacted] weeks pregnant.   [facility] discussed patient's case and imaging with the on-call OB/GYN, Dr. Alysia Penna,  Continue prenatal care.   Chronic Normocytic anemia: -Hemoglobin is 9.6 g/dL. -Check iron profile. -Patient will need iron considering the patient is pregnant.    DVT prophylaxis: SCDs Code Status:Full code Family Communication: No family at bed side. Disposition Plan:   Status is: Inpatient Remains inpatient appropriate because: Pregnant patient admitted with acute cholecystitis, underwent cholecystectomy.  Postoperative day 2.  Postoperatively LFTs trended up.  MRCP is ordered to rule  out cholangitis.   Consultants:  General surgery OB/GYN  Procedures: Laparoscopic cholecystectomy Antimicrobials:  Anti-infectives (From admission, onward)    Start     Dose/Rate Route Frequency Ordered Stop   11/06/21 1100  ceFAZolin (ANCEF) IVPB 2g/100 mL premix        2 g 200 mL/hr over 30 Minutes Intravenous Once 11/06/21 1000 11/06/21 1152   11/06/21 0954  ceFAZolin (ANCEF) 2-4 GM/100ML-% IVPB       Note to Pharmacy: Evern Bio K: cabinet override      11/06/21 0954 11/06/21 1154   11/05/21 1530  cefTRIAXone (ROCEPHIN) 2 g in sodium chloride 0.9 % 100 mL IVPB        2 g 200 mL/hr over 30 Minutes Intravenous  Once 11/05/21 1523 11/05/21 1607        Subjective: Patient was seen and examined at bedside.  Overnight events noted. Patient reports doing better but still reports having pain in the right upper quadrant.   Postoperative day 2.  She denies nausea and vomiting.  Objective: Vitals:   11/07/21 2036 11/07/21 2135 11/08/21 0157 11/08/21 0449  BP: (!) 103/56 112/73 111/65 113/61  Pulse: 99 93 88 95  Resp: 18 18 20 18   Temp: 98.1 F (36.7 C) 98.1 F (36.7 C) 98.2 F (36.8 C) 97.7 F (36.5 C)  TempSrc: Oral Oral Oral Oral  SpO2: 100% 96% 99% 99%  Weight:  82.8 kg  83 kg  Height:  5\' 5"  (1.651 m)      Intake/Output Summary (Last 24 hours) at 11/08/2021 1303 Last data filed at 11/08/2021 0700 Gross per 24 hour  Intake 2074.25 ml  Output 550 ml  Net 1524.25 ml   Filed Weights   11/06/21 1025 11/07/21 2135  11/08/21 0449  Weight: 88.5 kg 82.8 kg 83 kg    Examination:  General exam: Appears comfortable, not in any acute distress. Respiratory system: CTA bilaterally, respiratory effort normal, RR 15 Cardiovascular system: S1 & S2 heard, regular rate and rhythm, no murmur. Gastrointestinal system: Abdomen is soft, RUQ tenderness+, BS+, scars from laparoscopic surgery noted Central nervous system: Alert and oriented x 3. No focal neurological  deficits. Extremities: No Edema, no cyanosis, no clubbing. Skin: No rashes, lesions or ulcers Psychiatry: Judgement and insight appear normal. Mood & affect appropriate.     Data Reviewed: I have personally reviewed following labs and imaging studies  CBC: Recent Labs  Lab 11/05/21 1330 11/06/21 0401 11/07/21 0236 11/08/21 0037  WBC 12.6* 9.3 10.9* 7.1  NEUTROABS 10.4* 6.7  --   --   HGB 11.0* 9.0* 9.6* 9.4*  HCT 33.8* 28.0* 30.4* 28.3*  MCV 79.7* 80.5 82.6 79.5*  PLT 374 280 301 305   Basic Metabolic Panel: Recent Labs  Lab 11/05/21 1330 11/06/21 0401 11/07/21 1650 11/08/21 0037 11/08/21 0038  NA 136 133* 133*  --  132*  K 3.4* 3.2* 3.8  --  3.5  CL 104 107 102  --  103  CO2 23 21* 19*  --  21*  GLUCOSE 124* 89 90  --  104*  BUN 6 <5* <5*  --  <5*  CREATININE 0.62 0.57 0.59  --  0.50  CALCIUM 8.8* 8.4* 8.8*  --  8.4*  MG  --  1.7 1.5* 1.8  --   PHOS  --   --  3.2  --  3.0   GFR: Estimated Creatinine Clearance: 93.5 mL/min (by C-G formula based on SCr of 0.5 mg/dL). Liver Function Tests: Recent Labs  Lab 11/05/21 1330 11/06/21 0401 11/07/21 0236 11/07/21 1650 11/08/21 0037 11/08/21 0038  AST 37 26 41  --  83*  --   ALT 43 35 42  --  76*  --   ALKPHOS 80 62 69  --  65  --   BILITOT 1.2 1.7* 2.5*  --  2.4*  --   PROT 7.3 5.3* 5.6*  --  5.4*  --   ALBUMIN 2.7* 2.0* 2.2* 2.3* 2.1* 2.1*   Recent Labs  Lab 11/05/21 1330  LIPASE 24   No results for input(s): "AMMONIA" in the last 168 hours. Coagulation Profile: Recent Labs  Lab 11/06/21 0401  INR 1.2   Cardiac Enzymes: No results for input(s): "CKTOTAL", "CKMB", "CKMBINDEX", "TROPONINI" in the last 168 hours. BNP (last 3 results) No results for input(s): "PROBNP" in the last 8760 hours. HbA1C: No results for input(s): "HGBA1C" in the last 72 hours. CBG: Recent Labs  Lab 11/05/21 1402  GLUCAP 115*   Lipid Profile: No results for input(s): "CHOL", "HDL", "LDLCALC", "TRIG", "CHOLHDL",  "LDLDIRECT" in the last 72 hours. Thyroid Function Tests: No results for input(s): "TSH", "T4TOTAL", "FREET4", "T3FREE", "THYROIDAB" in the last 72 hours. Anemia Panel: No results for input(s): "VITAMINB12", "FOLATE", "FERRITIN", "TIBC", "IRON", "RETICCTPCT" in the last 72 hours. Sepsis Labs: No results for input(s): "PROCALCITON", "LATICACIDVEN" in the last 168 hours.  Recent Results (from the past 240 hour(s))  Resp Panel by RT-PCR (Flu A&B, Covid) Anterior Nasal Swab     Status: None   Collection Time: 11/05/21  2:07 PM   Specimen: Anterior Nasal Swab  Result Value Ref Range Status   SARS Coronavirus 2 by RT PCR NEGATIVE NEGATIVE Final    Comment: (NOTE) SARS-CoV-2 target nucleic acids  are NOT DETECTED.  The SARS-CoV-2 RNA is generally detectable in upper respiratory specimens during the acute phase of infection. The lowest concentration of SARS-CoV-2 viral copies this assay can detect is 138 copies/mL. A negative result does not preclude SARS-Cov-2 infection and should not be used as the sole basis for treatment or other patient management decisions. A negative result may occur with  improper specimen collection/handling, submission of specimen other than nasopharyngeal swab, presence of viral mutation(s) within the areas targeted by this assay, and inadequate number of viral copies(<138 copies/mL). A negative result must be combined with clinical observations, patient history, and epidemiological information. The expected result is Negative.  Fact Sheet for Patients:  EntrepreneurPulse.com.au  Fact Sheet for Healthcare Providers:  IncredibleEmployment.be  This test is no t yet approved or cleared by the [place] FDA and  has been authorized for detection and/or diagnosis of SARS-CoV-2 by FDA under an Emergency Use Authorization (EUA). This EUA will remain  in effect (meaning this test can be used) for the duration of the COVID-19  declaration under Section 564(b)(1) of the Act, 21 U.S.C.section 360bbb-3(b)(1), unless the authorization is terminated  or revoked sooner.       Influenza A by PCR NEGATIVE NEGATIVE Final   Influenza B by PCR NEGATIVE NEGATIVE Final    Comment: (NOTE) The Xpert Xpress SARS-CoV-2/FLU/RSV plus assay is intended as an aid in the diagnosis of influenza from Nasopharyngeal swab specimens and should not be used as a sole basis for treatment. Nasal washings and aspirates are unacceptable for Xpert Xpress SARS-CoV-2/FLU/RSV testing.  Fact Sheet for Patients: EntrepreneurPulse.com.au  Fact Sheet for Healthcare Providers: IncredibleEmployment.be  This test is not yet approved or cleared by the [place] FDA and has been authorized for detection and/or diagnosis of SARS-CoV-2 by FDA under an Emergency Use Authorization (EUA). This EUA will remain in effect (meaning this test can be used) for the duration of the COVID-19 declaration under Section 564(b)(1) of the Act, 21 U.S.C. section 360bbb-3(b)(1), unless the authorization is terminated or revoked.  Performed at [facility], [address] [place], [place] [address]     Radiology Studies: No results found.  Scheduled Meds:  acetaminophen  650 mg Oral Q6H   docusate sodium  100 mg Oral BID   Continuous Infusions:  0.9 % NaCl with KCl 20 mEq / L 75 mL/hr at 11/07/21 2323   lactated ringers Stopped (11/07/21 1803)   methocarbamol (ROBAXIN) IV Stopped (11/07/21 0042)     LOS: 3 days    Time spent: 50 mins    Rasheda Ledger, MD [facility]   If 7PM-7AM, please contact night-coverage

## 2021-11-08 NOTE — Progress Notes (Signed)
Progress Note: General Surgery Service   Chief Complaint/Subjective: Pain seems a bit better today.  Still some in RUQ and epigastric area up into her chest.  Eating well with no nausea.  Pain is mostly with movement as expected.  Objective: Vital signs in last 24 hours: Temp:  [97.7 F (36.5 C)-98.2 F (36.8 C)] 97.7 F (36.5 C) (11/06 0449) Pulse Rate:  [88-99] 95 (11/06 0449) Resp:  [18-20] 18 (11/06 0449) BP: (103-117)/(56-76) 113/61 (11/06 0449) SpO2:  [96 %-100 %] 99 % (11/06 0449) Weight:  [82.8 kg-83 kg] 83 kg (11/06 0449) Last BM Date :  (pta)  Intake/Output from previous day: 11/05 0701 - 11/06 0700 In: 2194.3 [P.O.:220; I.V.:1961.8; IV Piggyback:12.4] Out: 550 [Urine:550] Intake/Output this shift: No intake/output data recorded.  Constitutional: NAD; conversant; no deformities GI: soft, ND, incisions c/d/I, appropriately tender  Lab Results: CBC  Recent Labs    11/07/21 0236 11/08/21 0037  WBC 10.9* 7.1  HGB 9.6* 9.4*  HCT 30.4* 28.3*  PLT 301 305   BMET Recent Labs    11/07/21 1650 11/08/21 0038  NA 133* 132*  K 3.8 3.5  CL 102 103  CO2 19* 21*  GLUCOSE 90 104*  BUN <5* <5*  CREATININE 0.59 0.50  CALCIUM 8.8* 8.4*   PT/INR Recent Labs    11/06/21 0401  LABPROT 14.8  INR 1.2   ABG No results for input(s): "PHART", "HCO3" in the last 72 hours.  Invalid input(s): "PCO2", "PO2"  Anti-infectives: Anti-infectives (From admission, onward)    Start     Dose/Rate Route Frequency Ordered Stop   11/06/21 1100  ceFAZolin (ANCEF) IVPB 2g/100 mL premix        2 g 200 mL/hr over 30 Minutes Intravenous Once 11/06/21 1000 11/06/21 1152   11/06/21 0954  ceFAZolin (ANCEF) 2-4 GM/100ML-% IVPB       Note to Pharmacy: Jasmine Pang K: cabinet override      11/06/21 0954 11/06/21 1154   11/05/21 1530  cefTRIAXone (ROCEPHIN) 2 g in sodium chloride 0.9 % 100 mL IVPB        2 g 200 mL/hr over 30 Minutes Intravenous  Once 11/05/21 1523 11/05/21 1607        Medications: Scheduled Meds:  acetaminophen  650 mg Oral Q6H   docusate sodium  100 mg Oral BID   Continuous Infusions:  0.9 % NaCl with KCl 20 mEq / L 75 mL/hr at 11/07/21 2323   lactated ringers Stopped (11/07/21 1803)   methocarbamol (ROBAXIN) IV Stopped (11/07/21 0042)   PRN Meds:.acetaminophen **OR** acetaminophen, fentaNYL (SUBLIMAZE) injection, HYDROmorphone (DILAUDID) injection, methocarbamol (ROBAXIN) IV, naLOXone (NARCAN)  injection, ondansetron (ZOFRAN) IV, ondansetron (ZOFRAN) IV, oxyCODONE, oxyCODONE, simethicone  Assessment/Plan: POD 2, s/p lap chole for biliary colic, Dr. Thermon Leyland, 11/4 -LFTs up some today.  This could be related to her pregnancy and cholestasis; however, unable to completely rule out biliary obstruction as etiology either. -will order MRCP to evaluate biliary tree today. If this is normal then patient is surgically stable -discussed with nursing and primary service.  FEN - regular diet VTE - ok for chemical prophylaxis from our standpoint ID - none currently  [redacted]weeks gestation  GERD Hyperthyroidism Hypokalemia - 3.5   LOS: 3 days   Henreitta Cea, [facility] Use [email or web address] to contact on call provider

## 2021-11-09 LAB — HEPATIC FUNCTION PANEL
ALT: 124 U/L — ABNORMAL HIGH (ref 0–44)
AST: 127 U/L — ABNORMAL HIGH (ref 15–41)
Albumin: 2.1 g/dL — ABNORMAL LOW (ref 3.5–5.0)
Alkaline Phosphatase: 61 U/L (ref 38–126)
Bilirubin, Direct: 0.8 mg/dL — ABNORMAL HIGH (ref 0.0–0.2)
Indirect Bilirubin: 0.7 mg/dL (ref 0.3–0.9)
Total Bilirubin: 1.5 mg/dL — ABNORMAL HIGH (ref 0.3–1.2)
Total Protein: 5.3 g/dL — ABNORMAL LOW (ref 6.5–8.1)

## 2021-11-09 LAB — CBC
HCT: 28.2 % — ABNORMAL LOW (ref 36.0–46.0)
Hemoglobin: 9 g/dL — ABNORMAL LOW (ref 12.0–15.0)
MCH: 25.8 pg — ABNORMAL LOW (ref 26.0–34.0)
MCHC: 31.9 g/dL (ref 30.0–36.0)
MCV: 80.8 fL (ref 80.0–100.0)
Platelets: 302 10*3/uL (ref 150–400)
RBC: 3.49 MIL/uL — ABNORMAL LOW (ref 3.87–5.11)
RDW: 12.6 % (ref 11.5–15.5)
WBC: 6.1 10*3/uL (ref 4.0–10.5)
nRBC: 0 % (ref 0.0–0.2)

## 2021-11-09 LAB — SURGICAL PATHOLOGY

## 2021-11-09 MED ORDER — OXYCODONE HCL 5 MG PO TABS: 5.0000 mg | ORAL_TABLET | Freq: Four times a day (QID) | ORAL | 0 refills | Status: AC | PRN

## 2021-11-09 MED ORDER — BISACODYL 10 MG RE SUPP
10.0000 mg | Freq: Once | RECTAL | Status: AC
  Administered 2021-11-09: 10 mg via RECTAL
  Filled 2021-11-09: qty 1

## 2021-11-09 NOTE — TOC Transition Note (Signed)
Transition of Care [facility]) - CM/SW Discharge Note   Patient Details  Name: Ashley Owen MRN: [id number] Date of Birth: Apr 29, 1975  Transition of Care [facility]) CM/SW Contact:  Zenon Mayo, RN Phone Number: 11/09/2021, 11:27 AM   Clinical Narrative:    Patient is for dc today, she states her ride will be here around 2 pm when they get off work today.  MD has printed out a work note for patient as well.  She has no other needs.         Patient Goals and CMS Choice        Discharge Placement                       Discharge Plan and Services                                     Social Determinants of Health (SDOH) Interventions     Readmission Risk Interventions     No data to display

## 2021-11-09 NOTE — Discharge Summary (Signed)
Physician Discharge Summary  Ashley Owen:[id number] DOB: Mar 03, 1975 DOA: 11/05/2021  PCP: [facility]  Admit date: 11/05/2021  Discharge date: 11/09/2021  Admitted From:Home.  Disposition:  Home.  Recommendations for Outpatient Follow-up:  Follow up with PCP in 1-2 weeks. Please obtain BMP/CBC in one week. Advised to continue prenatal care with OB/GYN. Advised to follow-up with general surgery,  appointment has been made.  Home Health: None Equipment/Devices: None  Discharge Condition: Stable CODE STATUS:Full code Diet recommendation: Heart Healthy   Brief [facility] Course: This 46 y.o. G4P3 female who is [redacted] weeks pregnant with medical history significant for chronic anemia with associated baseline hemoglobin in 10-12 range who is admitted to [facility] on 11/05/2021 for further evaluation and management of right upper quadrant abdominal discomfort.  RUQ ultrasound shows cholelithiasis with gallbladder sludge.  General surgery was consulted. Patient underwent laparoscopic cholecystectomy on 11/06/2021.  Inflamed gallbladder with gallstones was found.  Patient cleared from surgical team to be discharged next day.  LFTs trended up,  post surgery this could be secondary to  pregnancy.  MRCP ruled out any bile duct obstruction.  Patient feels much better tolerated diet, denies nausea vomiting.  General surgery signed off.  Patient wants to be discharged.  Patient is being discharged home and will follow-up with general surgery in 2 weeks.  Patient will continue prenatal care with OB/GYN outpatient.   Discharge Diagnoses:  Principal Problem:   RUQ abdominal pain Active Problems:   Acute cholecystitis   Hypokalemia   Chronic anemia  Acute cholecystitis: -s/p laparoscopic cholecystectomy 11/06/21. -Postop management as per surgical team. -Incentive spirometry. -Patient cleared from surgery to be discharged. -LFTs trended up,  MRCP ruled out bile duct  obstruction. -Patient being discharged home   Hypokalemia / Hypomagnesemia: Potassium 3.2 on arrival. Magnesium -1.7 Replaced and resolved.   Nausea and vomiting: Resolved.  Improved.   Intrauterine pregnancy: G4P3 female currently [redacted] weeks pregnant.   [facility] discussed patient's case and imaging with the on-call OB/GYN, Dr. Alysia Penna,  Continue prenatal care.   Chronic Normocytic anemia: -Hemoglobin is 9.6 g/dL. -Check iron profile. -Patient continue iron considering the patient is pregnant.      Discharge Instructions  Discharge Instructions     Call MD for:  difficulty breathing, headache or visual disturbances   Complete by: As directed    Call MD for:  persistant dizziness or light-headedness   Complete by: As directed    Call MD for:  persistant nausea and vomiting   Complete by: As directed    Diet - low sodium heart healthy   Complete by: As directed    Diet Carb Modified   Complete by: As directed    Discharge instructions   Complete by: As directed    Advised to follow up with PCP in one week. Advised to continue prenatal care with OB/GYN. Advised to follow-up with general surgery appointment has been made.   Discharge wound care:   Complete by: As directed    Follow-up surgery in 1 week.   Increase activity slowly   Complete by: As directed       Allergies as of 11/09/2021   No Known Allergies      Medication List     STOP taking these medications    Bactrim DS 800-160 MG tablet Generic drug: sulfamethoxazole-trimethoprim   glycopyrrolate 1 MG tablet Commonly known as: ROBINUL   methylPREDNISolone 4 MG tablet Commonly known as: MEDROL  ondansetron 8 MG disintegrating tablet Commonly known as: ZOFRAN-ODT   pantoprazole 40 MG tablet Commonly known as: Protonix   potassium chloride SA 20 MEQ tablet Commonly known as: KLOR-CON M   promethazine 25 MG tablet Commonly known as: PHENERGAN       TAKE these medications     oxyCODONE 5 MG immediate release tablet Commonly known as: Oxy IR/ROXICODONE Take 1 tablet (5 mg total) by mouth every 6 (six) hours as needed for moderate pain.               Discharge Care Instructions  (From admission, onward)           Start     Ordered   11/09/21 0000  Discharge wound care:       Comments: Follow-up surgery in 1 week.   11/09/21 1103            Follow-up Information     Stechschulte, Hyman Hopes, MD Follow up on 12/02/2021.   Specialty: Surgery Why: 11am. Please arrive 30 minutes prior to your appointment for paperwork. Please bring a copy of your photo ID and insurance card. Contact information: [address] [place] [address] [phone]         [facility] Follow up on 11/16/2021.   Specialty: Internal Medicine Why: The office will call patient. Contact information: [address] [place] [address] [phone]                No Known Allergies  Consultations: General Surgery   Procedures/Studies: MR ABDOMEN MRCP WO CONTRAST  Result Date: 11/08/2021 CLINICAL DATA:  Concern for biliary obstruction. EXAM: MRI ABDOMEN WITHOUT CONTRAST  (INCLUDING MRCP) TECHNIQUE: Multiplanar multisequence MR imaging of the abdomen was performed. Heavily T2-weighted images of the biliary and pancreatic ducts were obtained, and three-dimensional MRCP images were rendered by post processing. COMPARISON:  Ultrasound November 05, 2021 and MRI September 19, 2021. FINDINGS: Lower chest: Trace bilateral pleural effusions. Hepatobiliary: No significant hepatic steatosis. Gallbladder surgically absent. Trace fluid and stranding along the liver and in the gallbladder fossa without walled-off fluid collection. No biliary ductal dilation. No choledocholithiasis. Pancreas: No pancreatic ductal dilation or evidence of acute inflammation. Spleen:  No splenomegaly. Adrenals/Urinary Tract: Bilateral  adrenal glands are within normal limits. No hydronephrosis. Stomach/Bowel: Visualized portions within the abdomen are unremarkable. Vascular/Lymphatic: No pathologically enlarged lymph nodes identified. No abdominal aortic aneurysm demonstrated. Other: Postsurgical change in the anterior abdominal wall. Sequela of subcutaneous injections in the anterior abdominal wall. Skin thickening with a subjacent subcutaneous fluid signal 5.4 x 2.2 cm collection or developing collection on image 34/7 in the right posterior paramedian lumbar soft tissues. Musculoskeletal: No acute osseous abnormality. IMPRESSION: 1. No biliary ductal dilation. No choledocholithiasis. 2. Trace fluid and stranding along the liver and in the gallbladder fossa without walled-off fluid collection. 3. Skin thickening with a subjacent subcutaneous fluid signal 5.4 x 2.2 cm collection or developing collection in the right posterior paramedian lumbar soft tissues, suggest correlation with direct visualization and if clinically indicated further evaluation with dedicated ultrasound. 4. Trace bilateral pleural effusions. Electronically Signed   By: Maudry Mayhew M.D.   On: 11/08/2021 15:37   US Abdomen Limited RUQ (LIVER/GB)  Result Date: 11/05/2021 CLINICAL DATA:  Abdominal pain, hemoptysis. EXAM: ULTRASOUND ABDOMEN LIMITED RIGHT UPPER QUADRANT COMPARISON:  Ultrasound September 14, 2021 and MRCP September 09, 2021 FINDINGS: Gallbladder: Cholelithiasis and sludge in a distended gallbladder. No gallbladder wall thickening  or pericholecystic fluid. Positive sonographic Murphy sign noted by sonographer. Common bile duct: Diameter: 4 mm Liver: No focal lesion identified. Within normal limits in parenchymal echogenicity. Portal vein is patent on color Doppler imaging with normal direction of blood flow towards the liver. Other: None. IMPRESSION: Cholelithiasis and sludge in a distended gallbladder. No gallbladder wall thickening or pericholecystic fluid.  Positive sonographic Murphy sign noted by sonographer. Findings are equivocal for acute cholecystitis. Electronically Signed   By: Dahlia Bailiff M.D.   On: 11/05/2021 15:10   US OB Limited > 14 wks  Result Date: 11/05/2021 CLINICAL DATA:  Three days of decreased fetal movement EXAM: LIMITED OBSTETRIC ULTRASOUND COMPARISON:  Ultrasound September 15, 2021 FINDINGS: Number of Fetuses: 1 Heart Rate:  153 bpm Movement: Yes Presentation: Cephalic Placental Location: Anterior Previa: No Amniotic Fluid (Subjective):  Within normal limits. MATERNAL FINDINGS: Cervix:  Appears closed.  Measuring 3.3 cm in length Uterus/Adnexae: No abnormality visualized. IMPRESSION: Single viable intrauterine pregnancy with a heart rate of 153 beats per minute. This exam is performed on an emergent basis and does not comprehensively evaluate fetal size, dating, or anatomy; follow-up complete OB US should be considered if further fetal assessment is warranted. Electronically Signed   By: Dahlia Bailiff M.D.   On: 11/05/2021 15:07    S/p laparoscopic cholecystectomy   Subjective: Patient was seen and examined at bedside.  Overnight events noted. Patient reports doing much better. She reports abdominal pain has resolved.   Nausea and vomiting has improved. She has tolerated soft diet and wants to be discharged.  Discharge Exam: Vitals:   11/09/21 0631 11/09/21 0823  BP: 111/76 106/63  Pulse:    Resp: 15 (!) 24  Temp: 98.3 F (36.8 C) 97.9 F (36.6 C)  SpO2: 98% 99%   Vitals:   11/09/21 0500 11/09/21 0622 11/09/21 0631 11/09/21 0823  BP:  94/64 111/76 106/63  Pulse:      Resp:   15 (!) 24  Temp:  98 F (36.7 C) 98.3 F (36.8 C) 97.9 F (36.6 C)  TempSrc:  Oral Oral Oral  SpO2:  98% 98% 99%  Weight: 80.9 kg     Height:        General: Pt is alert, awake, not in acute distress Cardiovascular: RRR, S1/S2 +, no rubs, no gallops Respiratory: CTA bilaterally, no wheezing, no rhonchi Abdominal: Soft, NT, ND,  bowel sounds + laparoscopic surgical scars noted. Extremities: no edema, no cyanosis    The results of significant diagnostics from this hospitalization (including imaging, microbiology, ancillary and laboratory) are listed below for reference.     Microbiology: Recent Results (from the past 240 hour(s))  Resp Panel by RT-PCR (Flu A&B, Covid) Anterior Nasal Swab     Status: None   Collection Time: 11/05/21  2:07 PM   Specimen: Anterior Nasal Swab  Result Value Ref Range Status   SARS Coronavirus 2 by RT PCR NEGATIVE NEGATIVE Final    Comment: (NOTE) SARS-CoV-2 target nucleic acids are NOT DETECTED.  The SARS-CoV-2 RNA is generally detectable in upper respiratory specimens during the acute phase of infection. The lowest concentration of SARS-CoV-2 viral copies this assay can detect is 138 copies/mL. A negative result does not preclude SARS-Cov-2 infection and should not be used as the sole basis for treatment or other patient management decisions. A negative result may occur with  improper specimen collection/handling, submission of specimen other than nasopharyngeal swab, presence of viral mutation(s) within the areas targeted by this assay, and  inadequate number of viral copies(<138 copies/mL). A negative result must be combined with clinical observations, patient history, and epidemiological information. The expected result is Negative.  Fact Sheet for Patients:  BloggerCourse.comhttps://www.fda.gov/media/152166/download  Fact Sheet for Healthcare Providers:  SeriousBroker.ithttps://www.fda.gov/media/152162/download  This test is no t yet approved or cleared by the [place] FDA and  has been authorized for detection and/or diagnosis of SARS-CoV-2 by FDA under an Emergency Use Authorization (EUA). This EUA will remain  in effect (meaning this test can be used) for the duration of the COVID-19 declaration under Section 564(b)(1) of the Act, 21 U.S.C.section 360bbb-3(b)(1), unless the authorization is  terminated  or revoked sooner.       Influenza A by PCR NEGATIVE NEGATIVE Final   Influenza B by PCR NEGATIVE NEGATIVE Final    Comment: (NOTE) The Xpert Xpress SARS-CoV-2/FLU/RSV plus assay is intended as an aid in the diagnosis of influenza from Nasopharyngeal swab specimens and should not be used as a sole basis for treatment. Nasal washings and aspirates are unacceptable for Xpert Xpress SARS-CoV-2/FLU/RSV testing.  Fact Sheet for Patients: BloggerCourse.comhttps://www.fda.gov/media/152166/download  Fact Sheet for Healthcare Providers: SeriousBroker.ithttps://www.fda.gov/media/152162/download  This test is not yet approved or cleared by the [place] FDA and has been authorized for detection and/or diagnosis of SARS-CoV-2 by FDA under an Emergency Use Authorization (EUA). This EUA will remain in effect (meaning this test can be used) for the duration of the COVID-19 declaration under Section 564(b)(1) of the Act, 21 U.S.C. section 360bbb-3(b)(1), unless the authorization is terminated or revoked.  Performed at [facility], [address] [place], [place] [id number]      Labs: BNP (last 3 results) No results for input(s): "BNP" in the last 8760 hours. Basic Metabolic Panel: Recent Labs  Lab 11/05/21 1330 11/06/21 0401 11/07/21 1650 11/08/21 0037 11/08/21 0038  NA 136 133* 133*  --  132*  K 3.4* 3.2* 3.8  --  3.5  CL 104 107 102  --  103  CO2 23 21* 19*  --  21*  GLUCOSE 124* 89 90  --  104*  BUN 6 <5* <5*  --  <5*  CREATININE 0.62 0.57 0.59  --  0.50  CALCIUM 8.8* 8.4* 8.8*  --  8.4*  MG  --  1.7 1.5* 1.8  --   PHOS  --   --  3.2  --  3.0   Liver Function Tests: Recent Labs  Lab 11/05/21 1330 11/06/21 0401 11/07/21 0236 11/07/21 1650 11/08/21 0037 11/08/21 0038 11/09/21 0044  AST 37 26 41  --  83*  --  127*  ALT 43 35 42  --  76*  --  124*  ALKPHOS 80 62 69  --  65  --  61  BILITOT 1.2 1.7* 2.5*  --  2.4*  --  1.5*  PROT 7.3 5.3* 5.6*  --  5.4*  --  5.3*   ALBUMIN 2.7* 2.0* 2.2* 2.3* 2.1* 2.1* 2.1*   Recent Labs  Lab 11/05/21 1330  LIPASE 24   No results for input(s): "AMMONIA" in the last 168 hours. CBC: Recent Labs  Lab 11/05/21 1330 11/06/21 0401 11/07/21 0236 11/08/21 0037 11/09/21 0044  WBC 12.6* 9.3 10.9* 7.1 6.1  NEUTROABS 10.4* 6.7  --   --   --   HGB 11.0* 9.0* 9.6* 9.4* 9.0*  HCT 33.8* 28.0* 30.4* 28.3* 28.2*  MCV 79.7* 80.5 82.6 79.5* 80.8  PLT 374 280 301 305 302   Cardiac Enzymes: No  results for input(s): "CKTOTAL", "CKMB", "CKMBINDEX", "TROPONINI" in the last 168 hours. BNP: Invalid input(s): "POCBNP" CBG: Recent Labs  Lab 11/05/21 1402  GLUCAP 115*   D-Dimer No results for input(s): "DDIMER" in the last 72 hours. Hgb A1c No results for input(s): "HGBA1C" in the last 72 hours. Lipid Profile No results for input(s): "CHOL", "HDL", "LDLCALC", "TRIG", "CHOLHDL", "LDLDIRECT" in the last 72 hours. Thyroid function studies No results for input(s): "TSH", "T4TOTAL", "T3FREE", "THYROIDAB" in the last 72 hours.  Invalid input(s): "FREET3" Anemia work up No results for input(s): "VITAMINB12", "FOLATE", "FERRITIN", "TIBC", "IRON", "RETICCTPCT" in the last 72 hours. Urinalysis    Component Value Date/Time   COLORURINE AMBER (A) 11/05/2021 1535   APPEARANCEUR HAZY (A) 11/05/2021 1535   LABSPEC 1.015 11/05/2021 1535   PHURINE >=9.0 11/05/2021 1535   GLUCOSEU NEGATIVE 11/05/2021 1535   HGBUR NEGATIVE 11/05/2021 1535   BILIRUBINUR MODERATE (A) 11/05/2021 1535   KETONESUR 15 (A) 11/05/2021 1535   PROTEINUR 100 (A) 11/05/2021 1535   NITRITE NEGATIVE 11/05/2021 1535   LEUKOCYTESUR NEGATIVE 11/05/2021 1535   Sepsis Labs Recent Labs  Lab 11/06/21 0401 11/07/21 0236 11/08/21 0037 11/09/21 0044  WBC 9.3 10.9* 7.1 6.1   Microbiology Recent Results (from the past 240 hour(s))  Resp Panel by RT-PCR (Flu A&B, Covid) Anterior Nasal Swab     Status: None   Collection Time: 11/05/21  2:07 PM   Specimen:  Anterior Nasal Swab  Result Value Ref Range Status   SARS Coronavirus 2 by RT PCR NEGATIVE NEGATIVE Final    Comment: (NOTE) SARS-CoV-2 target nucleic acids are NOT DETECTED.  The SARS-CoV-2 RNA is generally detectable in upper respiratory specimens during the acute phase of infection. The lowest concentration of SARS-CoV-2 viral copies this assay can detect is 138 copies/mL. A negative result does not preclude SARS-Cov-2 infection and should not be used as the sole basis for treatment or other patient management decisions. A negative result may occur with  improper specimen collection/handling, submission of specimen other than nasopharyngeal swab, presence of viral mutation(s) within the areas targeted by this assay, and inadequate number of viral copies(<138 copies/mL). A negative result must be combined with clinical observations, patient history, and epidemiological information. The expected result is Negative.  Fact Sheet for Patients:  BloggerCourse.com  Fact Sheet for Healthcare Providers:  SeriousBroker.it  This test is no t yet approved or cleared by the [place] FDA and  has been authorized for detection and/or diagnosis of SARS-CoV-2 by FDA under an Emergency Use Authorization (EUA). This EUA will remain  in effect (meaning this test can be used) for the duration of the COVID-19 declaration under Section 564(b)(1) of the Act, 21 U.S.C.section 360bbb-3(b)(1), unless the authorization is terminated  or revoked sooner.       Influenza A by PCR NEGATIVE NEGATIVE Final   Influenza B by PCR NEGATIVE NEGATIVE Final    Comment: (NOTE) The Xpert Xpress SARS-CoV-2/FLU/RSV plus assay is intended as an aid in the diagnosis of influenza from Nasopharyngeal swab specimens and should not be used as a sole basis for treatment. Nasal washings and aspirates are unacceptable for Xpert Xpress SARS-CoV-2/FLU/RSV testing.  Fact  Sheet for Patients: BloggerCourse.com  Fact Sheet for Healthcare Providers: SeriousBroker.it  This test is not yet approved or cleared by the [place] FDA and has been authorized for detection and/or diagnosis of SARS-CoV-2 by FDA under an Emergency Use Authorization (EUA). This EUA will remain in effect (meaning this test can be used)  for the duration of the COVID-19 declaration under Section 564(b)(1) of the Act, 21 U.S.C. section 360bbb-3(b)(1), unless the authorization is terminated or revoked.  Performed at [facility], [address] [place], [place] [address]      Time coordinating discharge: Over 30 minutes  SIGNED:   Cipriano Bunker, MD  [facility] 11/09/2021, 2:07 PM Pager   If 7PM-7AM, please contact night-coverage

## 2021-11-09 NOTE — Progress Notes (Signed)
Progress Note: General Surgery Service   Chief Complaint/Subjective: Feels well today.  Hoping to go home.  Some soreness with movement.  Tolerating diet  Objective: Vital signs in last 24 hours: Temp:  [97.9 F (36.6 C)-98.6 F (37 C)] 97.9 F (36.6 C) (11/07 0823) Pulse Rate:  [90-97] 90 (11/06 1929) Resp:  [15-24] 24 (11/07 0823) BP: (94-111)/(63-76) 106/63 (11/07 0823) SpO2:  [98 %-100 %] 99 % (11/07 0823) Weight:  [80.9 kg] 80.9 kg (11/07 0500) Last BM Date :  (PTA, med notified)  Intake/Output from previous day: 11/06 0701 - 11/07 0700 In: 1738.8 [P.O.:560; I.V.:1178.8] Out: 750 [Urine:750] Intake/Output this shift: Total I/O In: 120 [P.O.:120] Out: -   Constitutional: NAD; conversant; no deformities GI: soft, ND, incisions c/d/I, appropriately tender  Lab Results: CBC  Recent Labs    11/08/21 0037 11/09/21 0044  WBC 7.1 6.1  HGB 9.4* 9.0*  HCT 28.3* 28.2*  PLT 305 302   BMET Recent Labs    11/07/21 1650 11/08/21 0038  NA 133* 132*  K 3.8 3.5  CL 102 103  CO2 19* 21*  GLUCOSE 90 104*  BUN <5* <5*  CREATININE 0.59 0.50  CALCIUM 8.8* 8.4*   PT/INR No results for input(s): "LABPROT", "INR" in the last 72 hours.  ABG No results for input(s): "PHART", "HCO3" in the last 72 hours.  Invalid input(s): "PCO2", "PO2"  Anti-infectives: Anti-infectives (From admission, onward)    Start     Dose/Rate Route Frequency Ordered Stop   11/06/21 1100  ceFAZolin (ANCEF) IVPB 2g/100 mL premix        2 g 200 mL/hr over 30 Minutes Intravenous Once 11/06/21 1000 11/06/21 1152   11/06/21 0954  ceFAZolin (ANCEF) 2-4 GM/100ML-% IVPB       Note to Pharmacy: Jasmine Pang K: cabinet override      11/06/21 0954 11/06/21 1154   11/05/21 1530  cefTRIAXone (ROCEPHIN) 2 g in sodium chloride 0.9 % 100 mL IVPB        2 g 200 mL/hr over 30 Minutes Intravenous  Once 11/05/21 1523 11/05/21 1607       Medications: Scheduled Meds:  acetaminophen  650 mg Oral Q6H    docusate sodium  100 mg Oral BID   Continuous Infusions:  0.9 % NaCl with KCl 20 mEq / L Stopped (11/09/21 1025)   lactated ringers Stopped (11/07/21 1803)   methocarbamol (ROBAXIN) IV Stopped (11/07/21 0042)   PRN Meds:.acetaminophen **OR** acetaminophen, fentaNYL (SUBLIMAZE) injection, HYDROmorphone (DILAUDID) injection, methocarbamol (ROBAXIN) IV, naLOXone (NARCAN)  injection, ondansetron (ZOFRAN) IV, ondansetron (ZOFRAN) IV, oxyCODONE, oxyCODONE, simethicone  Assessment/Plan: POD 3, s/p lap chole for biliary colic, Dr. Thermon Leyland, 11/4 -LFTs up some , but MRCP negative for CBD stone.  Suspect her LFT elevation secondary to pregnancy at this point. -no further surgical intervention.  She is surgically stable for DC home from our standpoint. -follow up arranged.  FEN - regular diet VTE - ok for chemical prophylaxis from our standpoint ID - none currently  [redacted]weeks gestation  GERD Hyperthyroidism Hypokalemia - 3.5   LOS: 4 days   Henreitta Cea, [facility] Use [email or web address] to contact on call provider

## 2021-11-09 NOTE — Progress Notes (Signed)
Explained discharge instructions to patient. Reviewed follow up appointment and next medication administration times. Also reviewed education. Patient verbalized having an understanding for instructions given. All belongings are in the patient's possession. IV removed. No additional needs verbalized. Transported downstairs to the discharge lounge to await her ride.

## 2022-05-27 ENCOUNTER — Emergency Department (HOSPITAL_BASED_OUTPATIENT_CLINIC_OR_DEPARTMENT_OTHER)
Admission: EM | Admit: 2022-05-27 | Discharge: 2022-05-27 | Disposition: A | Discharge status: Home / Self Care | Payer: Medicaid Other | Attending: Emergency Medicine | Admitting: Emergency Medicine

## 2022-05-27 ENCOUNTER — Other Ambulatory Visit: Payer: Self-pay

## 2022-05-27 ENCOUNTER — Encounter (HOSPITAL_BASED_OUTPATIENT_CLINIC_OR_DEPARTMENT_OTHER): Payer: Self-pay

## 2022-05-27 DIAGNOSIS — R197 Diarrhea, unspecified: Secondary | ICD-10-CM | POA: Insufficient documentation

## 2022-05-27 DIAGNOSIS — R112 Nausea with vomiting, unspecified: Secondary | ICD-10-CM | POA: Diagnosis present

## 2022-05-27 DIAGNOSIS — K529 Noninfective gastroenteritis and colitis, unspecified: Secondary | ICD-10-CM

## 2022-05-27 LAB — CBC
HCT: 41.5 % (ref 36.0–46.0)
Hemoglobin: 13 g/dL (ref 12.0–15.0)
MCH: 26.2 pg (ref 26.0–34.0)
MCHC: 31.3 g/dL (ref 30.0–36.0)
MCV: 83.7 fL (ref 80.0–100.0)
Platelets: 305 10*3/uL (ref 150–400)
RBC: 4.96 MIL/uL (ref 3.87–5.11)
RDW: 14.2 % (ref 11.5–15.5)
WBC: 5.7 10*3/uL (ref 4.0–10.5)
nRBC: 0 % (ref 0.0–0.2)

## 2022-05-27 LAB — COMPREHENSIVE METABOLIC PANEL
ALT: 24 U/L (ref 0–44)
AST: 33 U/L (ref 15–41)
Albumin: 4.5 g/dL (ref 3.5–5.0)
Alkaline Phosphatase: 66 U/L (ref 38–126)
Anion gap: 11 (ref 5–15)
BUN: 22 mg/dL — ABNORMAL HIGH (ref 6–20)
CO2: 21 mmol/L — ABNORMAL LOW (ref 22–32)
Calcium: 9.2 mg/dL (ref 8.9–10.3)
Chloride: 107 mmol/L (ref 98–111)
Creatinine, Ser: 0.96 mg/dL (ref 0.44–1.00)
GFR, Estimated: >60 mL/min (ref >60)
Glucose, Bld: 162 mg/dL — ABNORMAL HIGH (ref 70–99)
Potassium: 3.9 mmol/L (ref 3.5–5.1)
Sodium: 139 mmol/L (ref 135–145)
Total Bilirubin: 1.5 mg/dL — ABNORMAL HIGH (ref 0.3–1.2)
Total Protein: 8.2 g/dL — ABNORMAL HIGH (ref 6.5–8.1)

## 2022-05-27 LAB — LIPASE, BLOOD: Lipase: 23 U/L (ref 11–51)

## 2022-05-27 MED ORDER — SODIUM CHLORIDE 0.9 % IV BOLUS
1000.0000 mL | Freq: Once | INTRAVENOUS | Status: AC
  Administered 2022-05-27: 1000 mL via INTRAVENOUS

## 2022-05-27 MED ORDER — ONDANSETRON HCL 4 MG/2ML IJ SOLN
4.0000 mg | Freq: Once | INTRAMUSCULAR | Status: AC | PRN
  Administered 2022-05-27: 4 mg via INTRAVENOUS
  Filled 2022-05-27: qty 2

## 2022-05-27 NOTE — ED Triage Notes (Signed)
Patient stated she has been vomiting with diarrhea since last night. She denied fever or abd pain.

## 2022-05-27 NOTE — ED Provider Notes (Signed)
[facility] [facility] Provider Note   CSN: [id number] Arrival date & time: 05/27/22  0746     History  Chief Complaint  Patient presents with   Emesis    Ashley Owen is a 47 y.o. female.  HPI   47 year old female who is [place] speaking presents the emergency department with concern for nausea/vomiting/diarrhea since last night.  Interpreter services used.  Patient states that her symptoms started with mild nausea and now she has had multiple episodes of nonbloody emesis//watery stools.  She denies any significant abdominal pain.  She has mild cramping with these episodes but otherwise denies any abdominal distention.  No flank pain, pelvic pain, dysuria, vaginal bleeding/discharge.  Denies any fever.  No recent travel, new medications, sick contacts.  Home Medications Prior to Admission medications   Medication Sig Start Date End Date Taking? Authorizing Provider  oxyCODONE (OXY IR/ROXICODONE) 5 MG immediate release tablet Take 1 tablet (5 mg total) by mouth every 6 (six) hours as needed for moderate pain. 11/09/21   Barnetta Chapel, PA-C      Allergies    Patient has no known allergies.    Review of Systems   Review of Systems  Constitutional:  Positive for appetite change and fatigue. Negative for fever.  Respiratory:  Negative for shortness of breath.   Cardiovascular:  Negative for chest pain.  Gastrointestinal:  Positive for diarrhea, nausea and vomiting. Negative for abdominal pain, anal bleeding and blood in stool.  Genitourinary:  Negative for dysuria, pelvic pain, vaginal bleeding and vaginal discharge.  Musculoskeletal:  Negative for back pain.  Skin:  Negative for rash.  Neurological:  Negative for headaches.    Physical Exam Updated Vital Signs BP 96/70   Pulse 96   Temp 98.3 F (36.8 C) (Oral)   Resp 18   Ht 5\' 5"  (1.651 m)   Wt 80.9 kg   LMP  (LMP Unknown)   SpO2 99%   Breastfeeding Unknown   BMI 29.68 kg/m   Physical Exam Vitals and nursing note reviewed.  Constitutional:      General: She is not in acute distress.    Appearance: Normal appearance. She is not ill-appearing.  HENT:     Head: Normocephalic.     Mouth/Throat:     Mouth: Mucous membranes are moist.  Cardiovascular:     Rate and Rhythm: Normal rate.  Pulmonary:     Effort: Pulmonary effort is normal. No respiratory distress.  Abdominal:     General: Bowel sounds are normal. There is no distension.     Palpations: Abdomen is soft.     Tenderness: There is no abdominal tenderness. There is no guarding or rebound.  Skin:    General: Skin is warm.  Neurological:     Mental Status: She is alert and oriented to person, place, and time. Mental status is at baseline.  Psychiatric:        Mood and Affect: Mood normal.     ED Results / Procedures / Treatments   Labs (all labs ordered are listed, but only abnormal results are displayed) Labs Reviewed  CBC  LIPASE, BLOOD  COMPREHENSIVE METABOLIC PANEL  URINALYSIS, ROUTINE W REFLEX MICROSCOPIC  PREGNANCY, URINE    EKG None  Radiology No results found.  Procedures Procedures    Medications Ordered in ED Medications  ondansetron (ZOFRAN) injection 4 mg (has no administration in time range)  sodium chloride 0.9 % bolus 1,000 mL (1,000 mLs Intravenous  New Bag/Given 05/27/22 0941)    ED Course/ Medical Decision Making/ A&P                             Medical Decision Making Amount and/or Complexity of Data Reviewed Labs: ordered.  Risk Prescription drug management.   47 year old female presents the emergency department with mild mid abdominal pain associated with nonbloody vomiting/diarrhea.  This has been going on since last night.  No reported fever.  Vital signs are stable on arrival.  She is status post cholecystectomy.  Patient is well-appearing on exam, abdomen is non tender and benign.  Blood work is reassuring without acute abnormalities. Mild  dehydration noted with chronically elevated bilirubin. Lipase negative.  After IV medication she feels improved.  She is been able to eat and drink without difficulty.  No indication for emergent CT or imaging at this time.  She otherwise feels well.  Will plan for symptomatic treatment and outpatient follow-up.  Patient at this time appears safe and stable for discharge and close outpatient follow up. Discharge plan and strict return to ED precautions discussed, patient verbalizes understanding and agreement.        Final Clinical Impression(s) / ED Diagnoses Final diagnoses:  None    Rx / DC Orders ED Discharge Orders     None         Rozelle Logan, DO 05/27/22 1251

## 2022-05-27 NOTE — Discharge Instructions (Addendum)
You have been seen and discharged from the emergency department.  I believe you are suffering from a viral illness.  Your blood work was normal.  You were treated with IV medications.  Take new prescriptions as prescribed.  Stable hydrated.  Follow-up bland diet.  Follow-up with your primary provider for further evaluation and further care. Take home medications as prescribed. If you have any worsening symptoms or further concerns for your health please return to an emergency department for further evaluation.
